# Patient Record
Sex: Male | Born: 1970 | Race: White | Hispanic: No | Marital: Married | Smoking: Current some day smoker
Health system: Southern US, Community
[De-identification: ages and names within clinical notes are randomized; demographics above are authoritative.]

## PROBLEM LIST (undated history)

## (undated) DIAGNOSIS — E782 Mixed hyperlipidemia: Secondary | ICD-10-CM

## (undated) DIAGNOSIS — I10 Essential (primary) hypertension: Secondary | ICD-10-CM

## (undated) DIAGNOSIS — R7989 Other specified abnormal findings of blood chemistry: Secondary | ICD-10-CM

## (undated) DIAGNOSIS — G4733 Obstructive sleep apnea (adult) (pediatric): Secondary | ICD-10-CM

## (undated) DIAGNOSIS — N200 Calculus of kidney: Secondary | ICD-10-CM

## (undated) DIAGNOSIS — Z8249 Family history of ischemic heart disease and other diseases of the circulatory system: Secondary | ICD-10-CM

## (undated) DIAGNOSIS — I2510 Atherosclerotic heart disease of native coronary artery without angina pectoris: Secondary | ICD-10-CM

## (undated) DIAGNOSIS — R7301 Impaired fasting glucose: Secondary | ICD-10-CM

## (undated) HISTORY — DX: Other specified abnormal findings of blood chemistry: R79.89

## (undated) HISTORY — DX: Mixed hyperlipidemia: E78.2

## (undated) HISTORY — DX: Essential (primary) hypertension: I10

## (undated) HISTORY — DX: Calculus of kidney: N20.0

## (undated) HISTORY — DX: Atherosclerotic heart disease of native coronary artery without angina pectoris: I25.10

## (undated) HISTORY — DX: Impaired fasting glucose: R73.01

## (undated) HISTORY — DX: Family history of ischemic heart disease and other diseases of the circulatory system: Z82.49

## (undated) HISTORY — DX: Obstructive sleep apnea (adult) (pediatric): G47.33

## (undated) HISTORY — DX: Morbid (severe) obesity due to excess calories: E66.01

---

## 1996-03-25 HISTORY — PX: KIDNEY STONE SURGERY: SHX686

## 2001-03-06 ENCOUNTER — Encounter: Admission: RE | Admit: 2001-03-06 | Discharge: 2001-03-06 | Payer: Self-pay | Admitting: Family Medicine

## 2001-04-09 ENCOUNTER — Ambulatory Visit (HOSPITAL_BASED_OUTPATIENT_CLINIC_OR_DEPARTMENT_OTHER): Admission: RE | Admit: 2001-04-09 | Discharge: 2001-04-09 | Payer: Self-pay | Admitting: *Deleted

## 2001-04-24 ENCOUNTER — Encounter: Admission: RE | Admit: 2001-04-24 | Discharge: 2001-04-24 | Payer: Self-pay | Admitting: Family Medicine

## 2001-09-16 ENCOUNTER — Encounter: Admission: RE | Admit: 2001-09-16 | Discharge: 2001-09-16 | Payer: Self-pay | Admitting: Family Medicine

## 2002-06-03 ENCOUNTER — Encounter: Admission: RE | Admit: 2002-06-03 | Discharge: 2002-06-03 | Payer: Self-pay | Admitting: Family Medicine

## 2002-06-08 ENCOUNTER — Encounter: Admission: RE | Admit: 2002-06-08 | Discharge: 2002-06-08 | Payer: Self-pay | Admitting: Family Medicine

## 2006-11-11 ENCOUNTER — Ambulatory Visit: Payer: Self-pay | Admitting: Pulmonary Disease

## 2006-12-11 ENCOUNTER — Ambulatory Visit: Payer: Self-pay | Admitting: Pulmonary Disease

## 2010-07-17 ENCOUNTER — Other Ambulatory Visit: Payer: Self-pay | Admitting: Sports Medicine

## 2010-07-17 DIAGNOSIS — M5412 Radiculopathy, cervical region: Secondary | ICD-10-CM

## 2010-07-18 ENCOUNTER — Ambulatory Visit
Admission: RE | Admit: 2010-07-18 | Discharge: 2010-07-18 | Disposition: A | Discharge status: Home / Self Care | Payer: BC Managed Care – PPO | Source: Ambulatory Visit | Attending: Sports Medicine | Admitting: Sports Medicine

## 2010-07-18 DIAGNOSIS — M5412 Radiculopathy, cervical region: Secondary | ICD-10-CM

## 2010-08-07 NOTE — Assessment & Plan Note (Signed)
[facility]                             PULMONARY OFFICE NOTE   Robert [name]                     MRN:          [id number]  DATE:11/11/2006                            DOB:          1970-08-24    No dictation     Barbaraann Share, MD,FCCP  Electronically Signed    [facility]Med[facility]  DD: 11/11/2006  DT: 11/12/2006  Job #: [id number]

## 2010-08-07 NOTE — Assessment & Plan Note (Signed)
[facility]                             PULMONARY OFFICE NOTE   NAME:[name], [name]                    MRN:          [id number]  DATE:11/11/2006                            DOB:          02-Jan-1971    SELF-REFERRAL SLEEP CONSULTATION   HISTORY OF PRESENT ILLNESS:  The patient is a 40 year old white male who  comes in today for evaluation of a prior documentation of sleep apnea.  The patient was diagnosed with sleep apnea approximately 6 years ago by  split-night study, and unfortunately, we have not been able to locate  this, nor has anyone kept the records.  The patient was placed on CPAP  but states that he could not sleep with it.  The patient states that he  could not have anything on his face, and therefore stopped using it.  He  states that he typically goes to bed between 8:30 and 11 and gets up at  5:30 a.m. to start his day.  He feels that he is rested.  He has been  noted to have snoring and pauses by his bed-partner.  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 physical filled out, which cannot be done until his sleep  is properly treated and evaluated.  The patient states that he does have  occasional sleep pressure with periods of inactivity, but this is not an  every day occurrence.  This is not an every day occurrence.  He  occasionally will doze with TV or movies in the evenings.  He denies  sleepiness with any driving.  Overall, his weight has been neutral over  the last 6 years.   PAST MEDICAL HISTORY:  Totally unremarkable.  The patient takes no  medications and has no known drug allergies.   SOCIAL HISTORY:  He does have children.  He has a history of smoking 1-  and-a-half packs per day and continues to do so.   FAMILY HISTORY:  Remarkable for his mother having allergies.  Otherwise,  noncontributory in first-degree relatives.   REVIEW OF SYSTEMS:  As per the  history of present illness.  Also see  patient intake form documented on the chart.   PHYSICAL EXAM:  GENERAL:  He is an obese white male in no acute  distress.  Blood pressure 118/84, pulse 84, temperature 97.9.  Weight 268 pounds.  He is 5 feet 8 inches tall.  O2 saturation on room air is 96%.  HEENT:  Pupils are equal, round, and reactive to light and  accommodation.  Extraocular muscles are intact.  Nares show mild septal  deviation to the left.  Oropharynx does show significant tissue  redundancy with elongation of the soft palate and uvula.  NECK:  Supple without JVD or lymphadenopathy.  No palpable thyromegaly.  CHEST:  totally clear.  CARDIAC:  Regular rate and rhythm.  No murmurs, rubs, or gallops.  ABDOMEN:  Soft and nontender with good bowel sounds.  GENITAL, RECTAL, AND BREASTS:  Not done and not indicated.  LOWER EXTREMITIES:  Without edema.  Pulses are intact distally.  NEUROLOGIC:  Alert and oriented with no obvious motor deficits.   IMPRESSION:  History of obstructive sleep apnea of unknown severity.  Currently, the patient is not being treated for this.  Even though he  denies a lot of the symptoms of inappropriate daytime sleepiness, he  does appear somewhat sleepy to me in the office, and I do believe that  he is more symptomatic than he believes.  Unfortunately, we have not  been able to locate his sleep study from the past, and therefore, it may  be prudent to go ahead and get him on an auto-titrate device with a mask  that he can tolerate until we can locate the study.  We can then either  do an MWT to verify his status or see if the Department of  Transportation will be satisfied with him being on CPAP compliantly for  a period of time.   PLAN:  1. Continue to try and locate the patient's old sleep study.  2. Go ahead and start an auto-titrate device with nasal pillows in the      interim.  3. I have counseled the patient on working on aggressive weight  loss.      The patient will follow up in 3 weeks or sooner if there are      problems.     Barbaraann Share, MD,FCCP  Electronically Signed    [facility]Med[facility]  DD: 11/11/2006  DT: 11/12/2006  Job #: [id number]   cc:   Gaspar Garbe, M.D.

## 2010-10-02 ENCOUNTER — Encounter (HOSPITAL_BASED_OUTPATIENT_CLINIC_OR_DEPARTMENT_OTHER)
Admission: RE | Admit: 2010-10-02 | Discharge: 2010-10-02 | Disposition: A | Discharge status: Home / Self Care | Payer: BC Managed Care – PPO | Source: Ambulatory Visit | Attending: Orthopedic Surgery | Admitting: Orthopedic Surgery

## 2010-10-03 ENCOUNTER — Ambulatory Visit (HOSPITAL_BASED_OUTPATIENT_CLINIC_OR_DEPARTMENT_OTHER)
Admission: RE | Admit: 2010-10-03 | Discharge: 2010-10-03 | Disposition: A | Discharge status: Home / Self Care | Payer: BC Managed Care – PPO | Source: Ambulatory Visit | Attending: Orthopedic Surgery | Admitting: Orthopedic Surgery

## 2010-10-03 DIAGNOSIS — G4733 Obstructive sleep apnea (adult) (pediatric): Secondary | ICD-10-CM | POA: Insufficient documentation

## 2010-10-03 DIAGNOSIS — G5600 Carpal tunnel syndrome, unspecified upper limb: Secondary | ICD-10-CM | POA: Insufficient documentation

## 2010-10-03 DIAGNOSIS — E669 Obesity, unspecified: Secondary | ICD-10-CM | POA: Insufficient documentation

## 2010-10-03 DIAGNOSIS — Z01812 Encounter for preprocedural laboratory examination: Secondary | ICD-10-CM | POA: Insufficient documentation

## 2010-10-03 DIAGNOSIS — F17200 Nicotine dependence, unspecified, uncomplicated: Secondary | ICD-10-CM | POA: Insufficient documentation

## 2010-10-03 LAB — POCT HEMOGLOBIN-HEMACUE: Hemoglobin: 16.9 g/dL (ref 13.0–17.0)

## 2010-10-12 NOTE — Op Note (Signed)
  NAMENYSIR, FERGUSSON NO.:  [id number]  MEDICAL RECORD NO.:  [id number]  LOCATION:                                 FACILITY:  PHYSICIAN:  Cindee Salt, M.D.            DATE OF BIRTH:  DATE OF PROCEDURE:  10/03/2010 DATE OF DISCHARGE:                              OPERATIVE REPORT   PREOPERATIVE DIAGNOSIS:  Carpal tunnel syndrome, right hand.  POSTOPERATIVE DIAGNOSIS:  Carpal tunnel syndrome, right hand.  OPERATION:  Decompression of right median nerve.  SURGEON:  Cindee Salt, MD  ASSISTANT:  None.  ANESTHESIA:  Forearm based IV regional.  ANESTHESIOLOGIST:  Quita Skye. Krista Blue, MD  HISTORY:  The patient is a 40 year old male with a history of carpal tunnel syndrome, EMG nerve conduction is positive which has not responded to conservative treatment.  He is elected to undergo surgical decompression.  Per, peri, and postoperative course have been discussed along with risks and complications.  He is aware that there is no guarantee with surgery, possibility of infection, recurrence injury to arteries, nerves, and tendons, incomplete relief of symptoms, and dystrophy.  In the preoperative area, the patient is seen, the extremity is marked by both the patient and surgeon, and antibiotic is given.  PROCEDURE:  The patient was brought to the operating room where a forearm based IV regional anesthetic was carried out without difficulty. He was prepped using ChloraPrep, supine position, right arm free.  A 3- minute dry time was allowed.  A time-out taken confirming the patient and procedure.  A longitudinal incision was made in the palm and carried down through the subcutaneous tissue.  Bleeders were electrocauterized with bipolar.  The superficial palmar arch identified after splitting the palmar fascia.  A right-angle retractor was placed between the flexor tendons after identifying the flexor tendon of the ring and little finger protecting the median nerve.  A  longitudinal incision was made through the carpal retinaculum.  The Sewall retractor and right- angle retractor were placed between the skin and forearm fascia.  The fascia released for approximately a centimeter and half proximal to the wrist crease under direct vision.  Canal was explored.  Compression to the nerve was apparent with thickening of the tenosynovial tissue and epineurium.  No further lesions were identified.  The wound was closed after irrigation with interrupted 5-0 Vicryl Rapide suture.  Local infiltration was given with 0.25% Marcaine without epinephrine, 5 mL was used.  Sterile compressive dressing was applied.  On deflation of the tourniquet, all fingers were immediately pinked and taken to the recovery room for observation in satisfactory condition.  He will be discharged home return to the [facility] [place] in 1 week, on Vicodin.          ______________________________ Cindee Salt, M.D.     GK/MEDQ  D:  10/03/2010  T:  10/03/2010  Job:  [id number]  Electronically Signed by Cindee Salt M.D. on 10/12/2010 09:19:58 AM

## 2010-10-26 ENCOUNTER — Ambulatory Visit (HOSPITAL_BASED_OUTPATIENT_CLINIC_OR_DEPARTMENT_OTHER)
Admission: RE | Admit: 2010-10-26 | Discharge: 2010-10-26 | Disposition: A | Discharge status: Home / Self Care | Payer: BC Managed Care – PPO | Source: Ambulatory Visit | Attending: Orthopedic Surgery | Admitting: Orthopedic Surgery

## 2010-10-26 DIAGNOSIS — G5600 Carpal tunnel syndrome, unspecified upper limb: Secondary | ICD-10-CM | POA: Insufficient documentation

## 2010-10-26 DIAGNOSIS — Z01812 Encounter for preprocedural laboratory examination: Secondary | ICD-10-CM | POA: Insufficient documentation

## 2010-10-26 LAB — POCT HEMOGLOBIN-HEMACUE: Hemoglobin: 15.6 g/dL (ref 13.0–17.0)

## 2010-12-25 NOTE — Op Note (Signed)
NAMECODY, Robert [name] NO.:  [id number]  MEDICAL RECORD NO.:  [id number]  LOCATION:                                 FACILITY:  PHYSICIAN:  Cindee Salt, M.D.       DATE OF BIRTH:  01/22/1971  DATE OF PROCEDURE:  10/26/2010 DATE OF DISCHARGE:                              OPERATIVE REPORT   PREOPERATIVE DIAGNOSIS:  Carpal tunnel syndrome, left hand.  POSTOPERATIVE DIAGNOSIS:  Carpal tunnel syndrome, left hand.  OPERATION:  Decompression of left median nerve.  SURGEON:  Cindee Salt, MD  ANESTHESIA:  Forearm-based IV regional.  ANESTHESIOLOGIST:  Janetta Hora. Gelene Mink, MD  HISTORY:  The patient is a 40 year old male with a history of carpal tunnel syndrome, EMG nerve conduction is positive.  He has undergone release of his right side.  He is admitted now for release of his left. Pre, peri, postoperative courses have been discussed along with risks and complications.  He is aware that there is no guarantee with the surgery, possibility of infection, recurrence; injury to arteries, nerves, tendons; incomplete relief of symptoms, dystrophy.  In the preoperative area, the patient is seen.  The extremity marked by both the patient and surgeon.  Antibiotic given.  PROCEDURE IN DETAIL:  The patient was brought to the operating room where a forearm-based IV regional anesthetic was carried out without difficulty, was prepped using DuraPrep, supine position, left arm free. A 3-minute dry time was allowed.  Time-out taken, confirming the patient and procedure.  A longitudinal incision was made in the skin.  While this was being done, the patient forcefully flexed his fingers, pushing my hand with the scalpel longitudinally into his palm.  This was corrected as rapidly as possible so as not to punch the knife deeply into the palm.  It did not proceed into a deep cut.  The palmar fascia was split, superficial palmar arch identified, the flexor tendons of the ring and  little fingers identified to the ulnar side of the median nerve.  Carpal retinaculum was incised with sharp dissection.  The right- angled Sewell retractor were placed between skin and forearm fascia. The fascia released for approximately a centimeter and a half proximal to the wrist crease under direct vision.  Canal was thoroughly explored. Each of the flexor tendons were visualized to be certain that nothing was cut.  The median nerve was not injured nor was the superficial palmar arch.  The area compressing to the nerve was apparent.  No further lesions were identified.  The wound was copiously irrigated with saline.  The skin then closed with interrupted 5-0 Vicryl Rapide sutures.  A local infiltration with 0.25% Marcaine without epinephrine was given.  On deflation of the tourniquet, all fingers immediately pinked after placement of a compressive dressing.  No splint was applied.  The fingers were left free.  The patient tolerated the procedure well and was taken to the recovery room for observation in satisfactory condition.  He will be discharged home to return to the [facility] of [place] in 1 week on Vicodin.          ______________________________ Cindee Salt, M.D.     GK/MEDQ  D:  10/26/2010  T:  10/26/2010  Job:  [id number]  Electronically Signed by Cindee Salt M.D. on 12/25/2010 04:37:30 PM

## 2011-03-26 HISTORY — PX: CARPAL TUNNEL RELEASE: SHX101

## 2011-09-30 ENCOUNTER — Encounter: Payer: Self-pay | Admitting: Pulmonary Disease

## 2011-10-01 ENCOUNTER — Telehealth: Payer: Self-pay | Admitting: Pulmonary Disease

## 2011-10-01 ENCOUNTER — Encounter: Payer: Self-pay | Admitting: Pulmonary Disease

## 2011-10-01 ENCOUNTER — Ambulatory Visit (INDEPENDENT_AMBULATORY_CARE_PROVIDER_SITE_OTHER): Payer: BC Managed Care – PPO | Admitting: Pulmonary Disease

## 2011-10-01 VITALS — BP 116/78 | HR 95 | Temp 98.5°F | Ht 67.5 in | Wt 267.4 lb

## 2011-10-01 DIAGNOSIS — G4733 Obstructive sleep apnea (adult) (pediatric): Secondary | ICD-10-CM

## 2011-10-01 NOTE — Telephone Encounter (Signed)
I spoke with pt and he is calling to let [facility] know he is eligible for a new cpap machine 11/23/11. I advised will forward to [facility] so he is aware. Please advise thanks

## 2011-10-01 NOTE — Telephone Encounter (Signed)
If pt will call us at that time, will be happy to send order to his dme for a new machine.  Let us know.

## 2011-10-01 NOTE — Assessment & Plan Note (Signed)
The patient has a history of severe obstructive sleep apnea in 2003, and has been doing well on CPAP since that time.  He feels the device is working well for him currently, but he needs to work more aggressively on weight loss.  He also is overdue for a new mask.  I will send an order to his DME for a new mask, but he will also need a download for DOT purposes.  I've encouraged the patient to work aggressively on weight loss, and to followup with me on a yearly basis.

## 2011-10-01 NOTE — Progress Notes (Signed)
  Subjective:    Patient ID: Robert [name], male    DOB: 12/31/1970, 41 y.o.   MRN: [id number]  HPI The patient is a 41 year old male who comes in today for management of obstructive sleep apnea.  He was diagnosed in 2003 with severe OSA, with an AHI of 46 events per hour.  He was started on CPAP, and his pressure was optimized in 2008 to 14 cm of water.  He has been on CPAP since that time, but has not followed up with me since 2008.  He denies noncompliance with the device, and has not had any breakthrough snoring.  He has been keeping up with his mask changes and supplies, but is due for a mask currently.  He feels that he has excellent daytime alertness, and feels rested in the mornings upon arising.  He denies any sleepiness issues during the day with periods of inactivity, and does not fall asleep in the evening watching television.  He has no issues with sleepiness while driving.  The patient's weight is neutral from 2008, and his Epworth score today is normal at 5.   Review of Systems  Constitutional: Negative for fever and unexpected weight change.  HENT: Negative for ear pain, nosebleeds, congestion, sore throat, rhinorrhea, sneezing, trouble swallowing, dental problem, postnasal drip and sinus pressure.   Eyes: Negative for redness and itching.  Respiratory: Negative for cough, chest tightness, shortness of breath and wheezing.   Cardiovascular: Negative for palpitations and leg swelling.  Gastrointestinal: Negative for nausea and vomiting.  Genitourinary: Negative for dysuria.  Musculoskeletal: Negative for joint swelling.  Skin: Negative for rash.  Neurological: Negative for headaches.  Hematological: Does not bruise/bleed easily.  Psychiatric/Behavioral: Negative for dysphoric mood. The patient is not nervous/anxious.   All other systems reviewed and are negative.       Objective:   Physical Exam Constitutional:  Obese male, no acute distress  HENT:  Nares patent without  discharge  Oropharynx without exudate, palate and uvula are thick and elongated.  Eyes:  Perrla, eomi, no scleral icterus  Neck:  No JVD, no TMG  Cardiovascular:  Normal rate, regular rhythm, no rubs or gallops.  No murmurs        Intact distal pulses  Pulmonary :  Normal breath sounds, no stridor or respiratory distress   No rales, rhonchi, or wheezing  Abdominal:  Soft, nondistended, bowel sounds present.  No tenderness noted.   Musculoskeletal:  No lower extremity edema noted.  Lymph Nodes:  No cervical lymphadenopathy noted  Skin:  No cyanosis noted  Neurologic:  Alert, appropriate, moves all 4 extremities without obvious deficit.         Assessment & Plan:

## 2011-10-01 NOTE — Patient Instructions (Addendum)
Will have your dme get a download off your machine.  Once this is done, will make notes on this for you to take to your DOT physical Keep up with mask changes and supplies. Work on weight loss followup with me in one year.

## 2011-10-02 NOTE — Telephone Encounter (Signed)
Called, spoke with pt.  I informed him of below per Dr. Shelle Iron.  He verbalized understanding of this and in agreement with the plan to call us back when he is eligible for the new machine so we can place order.  Nothing further needed at this time.

## 2011-10-23 ENCOUNTER — Institutional Professional Consult (permissible substitution): Payer: BC Managed Care – PPO | Admitting: Pulmonary Disease

## 2012-03-02 ENCOUNTER — Telehealth: Payer: Self-pay | Admitting: Pulmonary Disease

## 2012-03-02 DIAGNOSIS — G4733 Obstructive sleep apnea (adult) (pediatric): Secondary | ICD-10-CM

## 2012-03-02 NOTE — Telephone Encounter (Signed)
Spoke with patient, I have informed him of the below.  Patient states he has been w [facility] "for years."  Order has been placed to switch DMEs and Sleep study has been faxed to [phone] Patient aware and nothing further needed at this time.

## 2012-03-02 NOTE — Addendum Note (Signed)
Addended by: Orma Flaming D on: 03/02/2012 02:46 PM   Modules accepted: Orders

## 2012-03-02 NOTE — Telephone Encounter (Signed)
Patient returned call, [facility] has received sleep study needs order for mask. Order has been placed.

## 2012-03-02 NOTE — Telephone Encounter (Signed)
LMTCB-need to make patient aware that we can place order through [facility] to change DME's however its up to his insurance to state weather or not patient has been with [facility] to long to change. We can check this and keep patient updated.

## 2015-06-19 ENCOUNTER — Telehealth: Payer: Self-pay | Admitting: Pulmonary Disease

## 2015-06-19 NOTE — Telephone Encounter (Signed)
Spoke with pt, wants to know if he is eligible for a new cpap machine.  Pt does not have any problems with his current machine but wants to upgrade if he is eligible.  I advised that we have not seen him since 2013 and would need to re-establish care for [place] to order a new cpap for him.  Pt scheduled next available with AD.  Nothing further needed.

## 2015-08-30 ENCOUNTER — Ambulatory Visit (INDEPENDENT_AMBULATORY_CARE_PROVIDER_SITE_OTHER)
Admission: RE | Admit: 2015-08-30 | Discharge: 2015-08-30 | Disposition: A | Discharge status: Home / Self Care | Payer: BLUE CROSS/BLUE SHIELD | Source: Ambulatory Visit | Attending: Pulmonary Disease | Admitting: Pulmonary Disease

## 2015-08-30 ENCOUNTER — Ambulatory Visit (INDEPENDENT_AMBULATORY_CARE_PROVIDER_SITE_OTHER): Payer: BLUE CROSS/BLUE SHIELD | Admitting: Pulmonary Disease

## 2015-08-30 ENCOUNTER — Encounter: Payer: Self-pay | Admitting: Pulmonary Disease

## 2015-08-30 VITALS — BP 128/72 | HR 80 | Ht 67.5 in | Wt 293.0 lb

## 2015-08-30 DIAGNOSIS — G4733 Obstructive sleep apnea (adult) (pediatric): Secondary | ICD-10-CM | POA: Diagnosis not present

## 2015-08-30 DIAGNOSIS — R0600 Dyspnea, unspecified: Secondary | ICD-10-CM | POA: Diagnosis not present

## 2015-08-30 DIAGNOSIS — R0609 Other forms of dyspnea: Secondary | ICD-10-CM | POA: Diagnosis not present

## 2015-08-30 DIAGNOSIS — Z720 Tobacco use: Secondary | ICD-10-CM

## 2015-08-30 HISTORY — DX: Dyspnea, unspecified: R06.00

## 2015-08-30 HISTORY — DX: Tobacco use: Z72.0

## 2015-08-30 HISTORY — DX: Other forms of dyspnea: R06.09

## 2015-08-30 NOTE — Assessment & Plan Note (Signed)
Weight reduction 

## 2015-08-30 NOTE — Progress Notes (Signed)
Subjective:    Patient ID: Robert [name], male    DOB: 07-25-1970, 45 y.o.   MRN: [id number]  HPI   This is the case of Robert [name], 45 y.o. Male, who is being seen for OSA.   As you very well know, patient  was diagnosed in 2003 with severe OSA, with an AHI of 46 events per hour. He was started on CPAP, and his pressure was optimized in 2008 to 14 cm of water.   He started with cpap in 2003, had issues with intial cpap. He got a new machine in 2008. Since that time, he has been using his cpap. Feels better using it.  The last 1-2 yrs, machine has not been delivering enough pressure. Machine is malfunctioning sometimes.  Through the years, machine has lost its luster. Occasional snoring, gasping, choking with current machine.   DL for jan 2017 -- 100%. Can NOt do AHI.   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   Review of Systems  Constitutional: Negative.  Negative for fever and unexpected weight change.  HENT: Negative.  Negative for congestion, dental problem, ear pain, nosebleeds, postnasal drip, rhinorrhea, sinus pressure, sneezing, sore throat and trouble swallowing.   Eyes: Negative.  Negative for redness and itching.  Respiratory: Positive for shortness of breath. Negative for cough, chest tightness and wheezing.   Cardiovascular: Negative.  Negative for palpitations and leg swelling.  Gastrointestinal: Negative.  Negative for nausea and vomiting.  Endocrine: Negative.   Genitourinary: Negative.  Negative for dysuria.  Musculoskeletal: Positive for arthralgias. Negative for joint swelling.  Skin: Negative.  Negative for rash.  Allergic/Immunologic: Negative.   Neurological: Negative.  Negative for headaches.  Hematological: Negative.  Does not bruise/bleed easily.  Psychiatric/Behavioral: Negative.  Negative for dysphoric mood. The patient is not nervous/anxious.    Past Medical History  Diagnosis Date  . OSA (obstructive sleep apnea)    (-) CVA, DVT,  CA  Family History  Problem Relation Age of Onset  . Allergies Mother   . Heart disease Father   . Pancreatic cancer Father      Past Surgical History  Procedure Laterality Date  . No past surgeries      Social History   Social History  . Marital Status: Married    Spouse Name: N/A  . Number of Children: N/A  . Years of Education: N/A   Occupational History  . 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 Smoking status: Current Some Day Smoker -- 1.00 packs/day for 25 years    Types: Cigarettes  . Smokeless tobacco: Not on file     Comment: currently on patch and trying to quit  . Alcohol Use: Yes  . Drug Use: No  . Sexual Activity: Not on file   Other Topics Concern  . Not on file   Social History Narrative  Lives in [place]. Works as an Psychiatrist.  Rarely ETOH use.  Has a CDL.   No Known Allergies   No outpatient prescriptions prior to visit.   No facility-administered medications prior to visit.   No orders of the defined types were placed in this encounter.               Objective:   Physical Exam  Vitals:  Filed Vitals:   08/30/15 1336  BP: 128/72  Pulse: 80  Height: 5' 7.5" (1.715 m)  Weight: 293 lb (132.904 kg)  SpO2: 95%  Constitutional/General:  Pleasant, well-nourished, well-developed, not in any distress,  Comfortably seating.  Well kempt  Body mass index is 45.19 kg/(m^2). Wt Readings from Last 3 Encounters:  08/30/15 293 lb (132.904 kg)  10/01/11 267 lb 6.4 oz (121.292 kg)    Neck circumference:   HEENT: Pupils equal and reactive to light and accommodation. Anicteric sclerae. Normal nasal mucosa.   No oral  lesions,  mouth clear,  oropharynx clear, no postnasal drip. (-) Oral thrush. No dental caries.  Airway - Mallampati class IV  Neck: No masses. Midline trachea. No JVD, (-) LAD. (-) bruits appreciated.  Respiratory/Chest: Grossly normal chest. (-) deformity. (-) Accessory muscle use.   Symmetric expansion. (-) Tenderness on palpation.  Resonant on percussion.  Diminished BS on both lower lung zones. (-) wheezing, crackles, rhonchi (-) egophony  Cardiovascular: Regular rate and  rhythm, heart sounds normal, no murmur or gallops, no peripheral edema  Gastrointestinal:  Normal bowel sounds. Soft, non-tender. No hepatosplenomegaly.  (-) masses.   Musculoskeletal:  Normal muscle tone. Normal gait.   Extremities: Grossly normal. (-) clubbing, cyanosis.  (-) edema  Skin: (-) rash,lesions seen.   Neurological/Psychiatric : alert, oriented to time, place, person. Normal mood and affect            Assessment & Plan:  Exertional dyspnea 25 PY smoking history. Denies asthma or copd. SOB with more than ADLs. CXR today.  May ned PFT, abg. May need to R/O OHS. Will hold off for now unless more symptomatic.   Morbid obesity ([place]) Weight reduction   Tobacco user Smoking cessation  OSA (obstructive sleep apnea) NPSG 2003:  AHI 46/hr, optimal pressure 11cm Autoset 2008:  Optimal pressure 14cm.   Patient  was diagnosed in 2003 with severe OSA, with an AHI of 46 events per hour. He was started on CPAP, and his pressure was optimized in 2008 to 14 cm of water.   He started with cpap in 2003, had issues with intial cpap. He got a new machine in 2008. Since that time, he has been using his cpap. Feels better using it.  The last 1-2 yrs, machine has not been delivering enough pressure. Machine is malfunctioning sometimes.  Through the years, machine has lost its luster. Occasional snoring, gasping, choking with current machine.   DL for jan 2017 -- 100%. Can NOt do AHI.   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   Plan : We extensively discussed the diagnosis, pathophysiology, and treatment options for Obstructive Sleep Apnea (OSA).  We discussed treatment options for OSA including CPAP, BiPaP, as well as surgical options and oral  devices.   We will start patient on autocpap 5-15 cm H2O. He was on CPAP 14 > may need higher pressure now.  Patient was instructed to call the office if he/she has not received the PAP device in 1-2 weeks.  Patient was instructed to have mask, tubings, filter, reservoir cleaned at least once a week with soapy water.  Patient was instructed to call the office if he/she is having issues with the PAP device.    I advised patient to obtain sufficient amount of sleep --  7 to 8 hours at least in a 24 hr period.  Patient was advised to follow good sleep hygiene.  Patient was advised NOT to engage in activities requiring concentration and/or vigilance if he/she is and  sleepy.  Patient is NOT to drive if he/she is sleepy.  Return to clinic in 2-3 mos.  Monica Becton, MD 08/30/2015, 2:16 PM [facility] Pager [phone] After 3 pm or if no answer, call [phone]

## 2015-08-30 NOTE — Assessment & Plan Note (Signed)
NPSG 2003:  AHI 46/hr, optimal pressure 11cm Autoset 2008:  Optimal pressure 14cm.   Patient  was diagnosed in 2003 with severe OSA, with an AHI of 46 events per hour. He was started on CPAP, and his pressure was optimized in 2008 to 14 cm of water.   He started with cpap in 2003, had issues with intial cpap. He got a new machine in 2008. Since that time, he has been using his cpap. Feels better using it.  The last 1-2 yrs, machine has not been delivering enough pressure. Machine is malfunctioning sometimes.  Through the years, machine has lost its luster. Occasional snoring, gasping, choking with current machine.   DL for jan 2017 -- 100%. Can NOt do AHI.   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   Plan : We extensively discussed the diagnosis, pathophysiology, and treatment options for Obstructive Sleep Apnea (OSA).  We discussed treatment options for OSA including CPAP, BiPaP, as well as surgical options and oral devices.   We will start patient on autocpap 5-15 cm H2O. He was on CPAP 14 > may need higher pressure now.  Patient was instructed to call the office if he/she has not received the PAP device in 1-2 weeks.  Patient was instructed to have mask, tubings, filter, reservoir cleaned at least once a week with soapy water.  Patient was instructed to call the office if he/she is having issues with the PAP device.    I advised patient to obtain sufficient amount of sleep --  7 to 8 hours at least in a 24 hr period.  Patient was advised to follow good sleep hygiene.  Patient was advised NOT to engage in activities requiring concentration and/or vigilance if he/she is and  sleepy.  Patient is NOT to drive if he/she is sleepy.

## 2015-08-30 NOTE — Patient Instructions (Signed)
  It was a pleasure taking care of you today!  You are diagnosed with Obstructive Sleep Apnea or OSA.  You stop breathing  46 times an hour.   We will order you a new CPAP  machine.  Your insurance may require you to do a sleep study to get a new CPAP machine. We will let you know. Please call the office if you do NOT receive your machine in the next 1-2 weeks.   Please make sure you use your CPAP device everytime you sleep.  We will monitor the usage of your machine per your insurance requirement.  Your insurance company may take the machine from you if you are not using it regularly.   Please clean the mask, tubings, filter, water reservoir with soapy water every week.  Please use distilled water for the water reservoir.   Please call the office or your machine provider (DME company) if you are having issues with the device.   Return to clinic in 2-3 months to see Dr. Corrie Dandy or NP.

## 2015-08-30 NOTE — Assessment & Plan Note (Signed)
Smoking cessation  

## 2015-08-30 NOTE — Assessment & Plan Note (Signed)
25 PY smoking history. Denies asthma or copd. SOB with more than ADLs. CXR today.  May ned PFT, abg. May need to R/O OHS. Will hold off for now unless more symptomatic.

## 2015-09-08 ENCOUNTER — Telehealth: Payer: Self-pay | Admitting: Pulmonary Disease

## 2015-09-08 DIAGNOSIS — G4733 Obstructive sleep apnea (adult) (pediatric): Secondary | ICD-10-CM

## 2015-09-08 NOTE — Telephone Encounter (Signed)
Spoke with pt. States that he would rather have his CPAP set up with [facility] instead of [facility]. Order has been placed. Nothing further was needed.

## 2015-11-09 ENCOUNTER — Ambulatory Visit (INDEPENDENT_AMBULATORY_CARE_PROVIDER_SITE_OTHER): Payer: BLUE CROSS/BLUE SHIELD | Admitting: Pulmonary Disease

## 2015-11-09 ENCOUNTER — Encounter: Payer: Self-pay | Admitting: Pulmonary Disease

## 2015-11-09 DIAGNOSIS — Z720 Tobacco use: Secondary | ICD-10-CM

## 2015-11-09 DIAGNOSIS — G4733 Obstructive sleep apnea (adult) (pediatric): Secondary | ICD-10-CM | POA: Diagnosis not present

## 2015-11-09 NOTE — Assessment & Plan Note (Signed)
NPSG 2003:  AHI 46/hr, optimal pressure 11cm Autoset 2008:  Optimal pressure 14cm.   Patient  was diagnosed in 2003 with severe OSA, with an AHI of 46 events per hour. He was started on CPAP, and his pressure was optimized in 2008 to 14 cm of water.   He started with cpap in 2003, had issues with intial cpap. He got a new machine in 2008. Since that time, he has been using his cpap. Feels better using it.  The last 1-2 yrs, machine has not been delivering enough pressure. Machine is malfunctioning sometimes.  Through the years, machine has lost its luster. Occasional snoring, gasping, choking with current machine.   We ended up getting a new auto CPAP machine on him in June 2017. 5-15 cm water. He feels better using it. More energy. Less sleepiness. No issues with it.  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   Plan : We extensively discussed the diagnosis, pathophysiology, and treatment options for Obstructive Sleep Apnea (OSA).  Cont autocpap 5-15 cm H2O. excellent download the last month. AHI of 2.   Patient was instructed to call the office if he/she has not received the PAP device in 1-2 weeks.  Patient was instructed to have mask, tubings, filter, reservoir cleaned at least once a week with soapy water.  Patient was instructed to call the office if he/she is having issues with the PAP device.    I advised patient to obtain sufficient amount of sleep --  7 to 8 hours at least in a 24 hr period.  Patient was advised to follow good sleep hygiene.  Patient was advised NOT to engage in activities requiring concentration and/or vigilance if he/she is and  sleepy.  Patient is NOT to drive if he/she is sleepy.

## 2015-11-09 NOTE — Assessment & Plan Note (Signed)
Weight reduction 

## 2015-11-09 NOTE — Progress Notes (Signed)
Subjective:    Patient ID: Robert [name], male    DOB: 27-Feb-1971, 45 y.o.   MRN: [id number]  HPI   This is the case of Robert [name], 45 y.o. Male, who is being seen for OSA.   As you very well know, patient  was diagnosed in 2003 with severe OSA, with an AHI of 46 events per hour. He was started on CPAP, and his pressure was optimized in 2008 to 14 cm of water.   He started with cpap in 2003, had issues with intial cpap. He got a new machine in 2008. Since that time, he has been using his cpap. Feels better using it.  The last 1-2 yrs, machine has not been delivering enough pressure. Machine is malfunctioning sometimes.  Through the years, machine has lost its luster. Occasional snoring, gasping, choking with current machine.   DL for jan 2017 -- 100%. Can NOt do AHI.   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   ROV 11/09/15 Patient returns to office as follow-up on his CPAP. Since last seen, he ended up getting a new auto CPAP machine. He feels better using it. More energy. No issues with it. Download last month, 100%, AHI of 2.7. He continues to smoke cigarettes, a pack a day. Less dyspnea. No medical issues since last seen.  Review of Systems  Constitutional: Negative.  Negative for fever and unexpected weight change.  HENT: Negative.  Negative for congestion, dental problem, ear pain, nosebleeds, postnasal drip, rhinorrhea, sinus pressure, sneezing, sore throat and trouble swallowing.   Eyes: Negative.  Negative for redness and itching.  Respiratory: Positive for shortness of breath. Negative for cough, chest tightness and wheezing.   Cardiovascular: Negative.  Negative for palpitations and leg swelling.  Gastrointestinal: Negative.  Negative for nausea and vomiting.  Endocrine: Negative.   Genitourinary: Negative.  Negative for dysuria.  Musculoskeletal: Positive for arthralgias. Negative for joint swelling.  Skin: Negative.  Negative for rash.  Allergic/Immunologic:  Negative.   Neurological: Negative.  Negative for headaches.  Hematological: Negative.  Does not bruise/bleed easily.  Psychiatric/Behavioral: Negative.  Negative for dysphoric mood. The patient is not nervous/anxious.    Past Medical History:  Diagnosis Date  . OSA (obstructive sleep apnea)    (-) CVA, DVT, CA  Family History  Problem Relation Age of Onset  . Allergies Mother   . Heart disease Father   . Pancreatic cancer Father      Past Surgical History:  Procedure Laterality Date  . NO PAST SURGERIES      Social History   Social History  . Marital status: Married    Spouse name: N/A  . Number of children: N/A  . Years of education: N/A   Occupational History  . 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 Smoking status: Current Some Day Smoker    Packs/day: 1.00    Years: 25.00    Types: Cigarettes  . Smokeless tobacco: Not on file     Comment: currently on patch and trying to quit  . Alcohol use Yes  . Drug use: No  . Sexual activity: Not on file   Other Topics Concern  . Not on file   Social History Narrative  . No narrative on file  Lives in [place]. Works as an Psychiatrist.  Rarely ETOH use.  Has a CDL.   No Known Allergies   No outpatient prescriptions prior to visit.   No  facility-administered medications prior to visit.    No orders of the defined types were placed in this encounter.              Objective:   Physical Exam  Vitals:  Vitals:   11/09/15 1438  BP: 138/78  Pulse: 87  SpO2: 96%  Weight: 294 lb (133.4 kg)  Height: 5' 7.5" (1.715 m)    Constitutional/General:  Pleasant, well-nourished, well-developed, not in any distress,  Comfortably seating.  Well kempt  Body mass index is 45.37 kg/m. Wt Readings from Last 3 Encounters:  11/09/15 294 lb (133.4 kg)  08/30/15 293 lb (132.9 kg)  10/01/11 267 lb 6.4 oz (121.3 kg)    Neck circumference:   HEENT: Pupils equal and reactive to light  and accommodation. Anicteric sclerae. Normal nasal mucosa.   No oral  lesions,  mouth clear,  oropharynx clear, no postnasal drip. (-) Oral thrush. No dental caries.  Airway - Mallampati class IV  Neck: No masses. Midline trachea. No JVD, (-) LAD. (-) bruits appreciated.  Respiratory/Chest: Grossly normal chest. (-) deformity. (-) Accessory muscle use.  Symmetric expansion. (-) Tenderness on palpation.  Resonant on percussion.  Diminished BS on both lower lung zones. (-) wheezing, crackles, rhonchi (-) egophony  Cardiovascular: Regular rate and  rhythm, heart sounds normal, no murmur or gallops, no peripheral edema  Gastrointestinal:  Normal bowel sounds. Soft, non-tender. No hepatosplenomegaly.  (-) masses.   Musculoskeletal:  Normal muscle tone. Normal gait.   Extremities: Grossly normal. (-) clubbing, cyanosis.  (-) edema  Skin: (-) rash,lesions seen.   Neurological/Psychiatric : alert, oriented to time, place, person. Normal mood and affect            Assessment & Plan:  OSA (obstructive sleep apnea) NPSG 2003:  AHI 46/hr, optimal pressure 11cm Autoset 2008:  Optimal pressure 14cm.   Patient  was diagnosed in 2003 with severe OSA, with an AHI of 46 events per hour. He was started on CPAP, and his pressure was optimized in 2008 to 14 cm of water.   He started with cpap in 2003, had issues with intial cpap. He got a new machine in 2008. Since that time, he has been using his cpap. Feels better using it.  The last 1-2 yrs, machine has not been delivering enough pressure. Machine is malfunctioning sometimes.  Through the years, machine has lost its luster. Occasional snoring, gasping, choking with current machine.   We ended up getting a new auto CPAP machine on him in June 2017. 5-15 cm water. He feels better using it. More energy. Less sleepiness. No issues with it.  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   Plan : We extensively  discussed the diagnosis, pathophysiology, and treatment options for Obstructive Sleep Apnea (OSA).  Cont autocpap 5-15 cm H2O. excellent download the last month. AHI of 2.   Patient was instructed to call the office if he/she has not received the PAP device in 1-2 weeks.  Patient was instructed to have mask, tubings, filter, reservoir cleaned at least once a week with soapy water.  Patient was instructed to call the office if he/she is having issues with the PAP device.    I advised patient to obtain sufficient amount of sleep --  7 to 8 hours at least in a 24 hr period.  Patient was advised to follow good sleep hygiene.  Patient was advised NOT to engage in activities requiring concentration and/or  vigilance if he/she is and  sleepy.  Patient is NOT to drive if he/she is sleepy.      Tobacco user Advised on smoking cessation.  Morbid obesity ([place]) Weight reduction.  Return to clinic in 1 yr.   Monica Becton, MD 11/09/2015, 3:22 PM [facility] Pager [phone] After 3 pm or if no answer, call [phone]

## 2015-11-09 NOTE — Patient Instructions (Signed)
    It was a pleasure taking care of you today!  Continue using your CPAP machine.   Please make sure you use your CPAP device everytime you sleep.  We will monitor the usage of your machine per your insurance requirement.  Your insurance company may take the machine from you if you are not using it regularly.   Please clean the mask, tubings, filter, water reservoir with soapy water every week.  Please use distilled water for the water reservoir.   Please call the office or your machine provider (DME company) if you are having issues with the device.   Return to clinic in 1 year.

## 2015-11-09 NOTE — Assessment & Plan Note (Signed)
Advised on smoking cessation.

## 2015-12-18 ENCOUNTER — Encounter: Payer: Self-pay | Admitting: Pulmonary Disease

## 2016-06-07 IMAGING — DX DG CHEST 2V
2 series · 2 of 2 positions shown · non-contrast
Comparison: None.

CLINICAL DATA: Dyspnea

EXAM:
CHEST  2 VIEW

[chest pa]
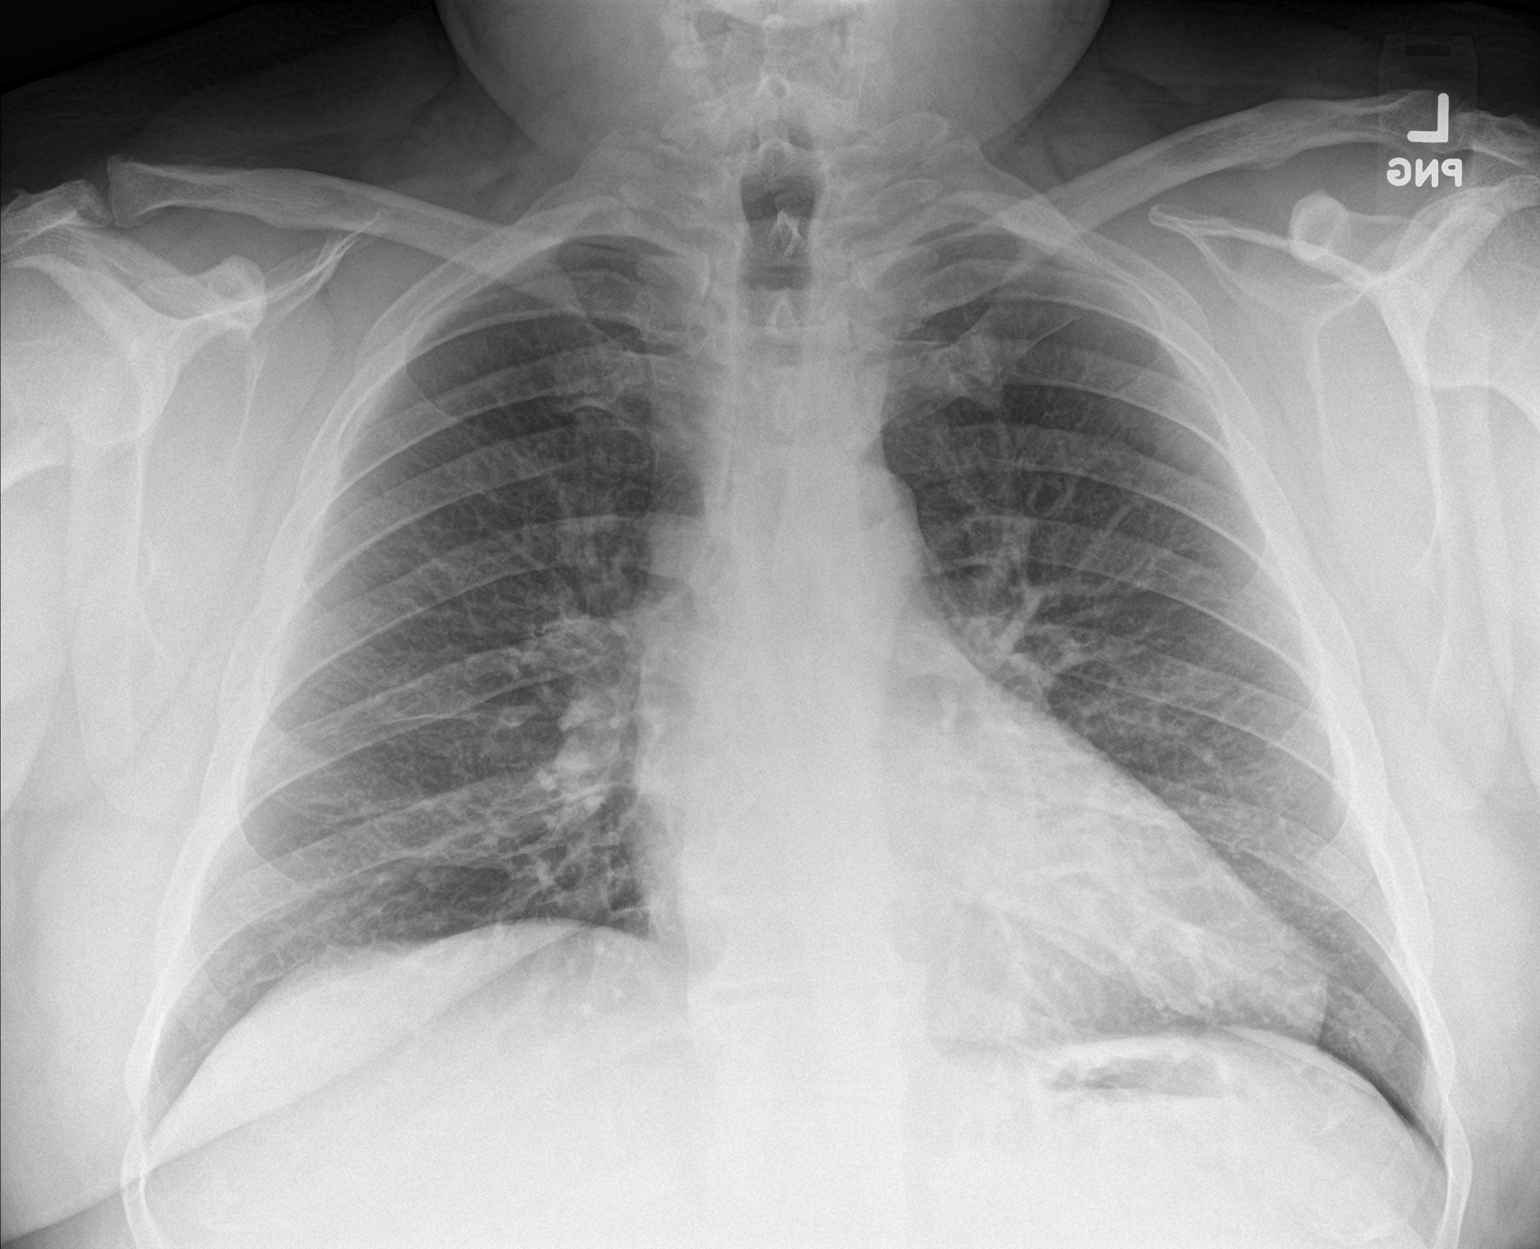

[chest lat]
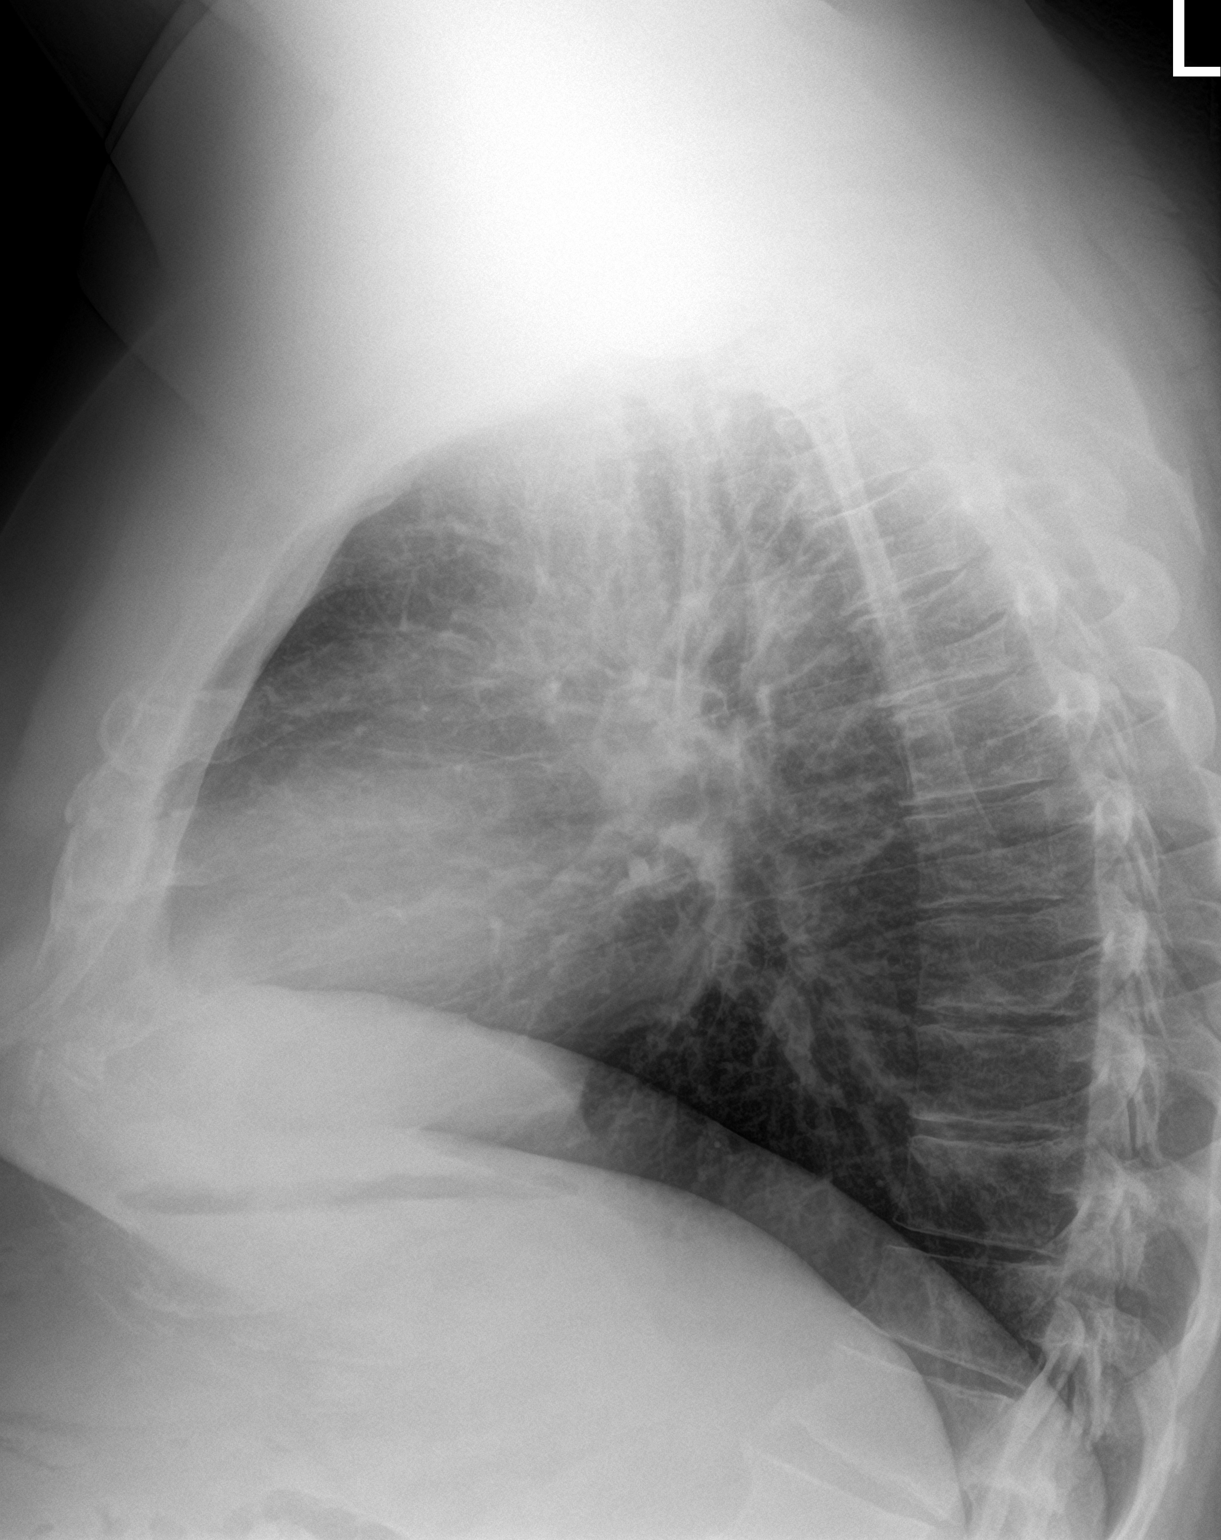

[2 of 2 positions shown; findings below may reference images not displayed]

FINDINGS: Normal heart size. Lungs clear. No pneumothorax. No pleural
effusion.
IMPRESSION: No active cardiopulmonary disease.

## 2016-10-11 ENCOUNTER — Ambulatory Visit (INDEPENDENT_AMBULATORY_CARE_PROVIDER_SITE_OTHER): Payer: 59 | Admitting: Pulmonary Disease

## 2016-10-11 ENCOUNTER — Encounter: Payer: Self-pay | Admitting: Pulmonary Disease

## 2016-10-11 VITALS — BP 124/88 | HR 87 | Ht 67.5 in | Wt 296.2 lb

## 2016-10-11 DIAGNOSIS — G4733 Obstructive sleep apnea (adult) (pediatric): Secondary | ICD-10-CM

## 2016-10-11 DIAGNOSIS — Z6841 Body Mass Index (BMI) 40.0 and over, adult: Secondary | ICD-10-CM

## 2016-10-11 NOTE — Patient Instructions (Signed)
Follow up in 1 year.

## 2016-10-11 NOTE — Progress Notes (Signed)
No current outpatient prescriptions on file prior to visit.   No current facility-administered medications on file prior to visit.      Chief Complaint  Patient presents with  . 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 Wears CPAP nightly, pressure 5-15cmH2O. Denies problems with mask or pressure.  DME: [facility]     Sleep tests PSG 04/09/01 >> AHI 46 Auto CPAP 09/11/16 to 10/10/16 >> used on 30 of 30 nights with average 8 hrs 25 min.  Average AHI 1.5 with median CPAP 14 and 95 th percentile CPAP 15 cm H2O  Past medical history Arthritis  Past surgical history, Family history, Social history, Allergies all reviewed.  Vital Signs BP 124/88 (BP Location: Left Arm, Cuff Size: Normal)   Pulse 87   Ht 5' 7.5" (1.715 m)   Wt 296 lb 3.2 oz (134.4 kg)   SpO2 97%   BMI 45.71 kg/m   History of Present Illness Robert [name] is a 46 y.o. male with obstructive sleep apnea.  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  He uses CPAP nightly.  He is sleeping well and no issues with daytime sleepiness.  No issues with mask fit.  Physical Exam  General - No distress Eyes - wears glasses ENT - No sinus tenderness, no oral exudate, no LAN Cardiac - s1s2 regular, no murmur Chest - No wheeze/rales/dullness Back - No focal tenderness Abd - Soft, non-tender Ext - No edema Neuro - Normal strength Skin - No rashes Psych - normal mood, and behavior   Assessment/Plan  Obstructive sleep apnea. - he is compliant with therapy and reports benefit from CPAP - continue auto CPAP  - no contraindications for him to continue his DOT license  Obesity. - discussed importance of weight loss   Patient Instructions  Follow up in 1 year    Chesley Mires, MD [place] Pager:  [phone] 10/11/2016, 4:44 PM

## 2016-10-17 ENCOUNTER — Emergency Department (HOSPITAL_BASED_OUTPATIENT_CLINIC_OR_DEPARTMENT_OTHER)
Admission: EM | Admit: 2016-10-17 | Discharge: 2016-10-18 | Disposition: A | Discharge status: Home / Self Care | Payer: 59 | Attending: Emergency Medicine | Admitting: Emergency Medicine

## 2016-10-17 ENCOUNTER — Encounter (HOSPITAL_BASED_OUTPATIENT_CLINIC_OR_DEPARTMENT_OTHER): Payer: Self-pay | Admitting: *Deleted

## 2016-10-17 DIAGNOSIS — T7802XA Anaphylactic reaction due to shellfish (crustaceans), initial encounter: Secondary | ICD-10-CM | POA: Diagnosis not present

## 2016-10-17 DIAGNOSIS — F17210 Nicotine dependence, cigarettes, uncomplicated: Secondary | ICD-10-CM | POA: Diagnosis not present

## 2016-10-17 DIAGNOSIS — T781XXA Other adverse food reactions, not elsewhere classified, initial encounter: Secondary | ICD-10-CM | POA: Diagnosis present

## 2016-10-17 DIAGNOSIS — T782XXA Anaphylactic shock, unspecified, initial encounter: Secondary | ICD-10-CM

## 2016-10-17 DIAGNOSIS — Z79899 Other long term (current) drug therapy: Secondary | ICD-10-CM | POA: Diagnosis not present

## 2016-10-17 MED ORDER — SODIUM CHLORIDE 0.9 % IV BOLUS (SEPSIS)
1000.0000 mL | Freq: Once | INTRAVENOUS | Status: AC
  Administered 2016-10-17: 1000 mL via INTRAVENOUS

## 2016-10-17 MED ORDER — EPINEPHRINE 0.3 MG/0.3ML IJ SOAJ
0.3000 mg | Freq: Once | INTRAMUSCULAR | Status: AC
  Administered 2016-10-17: 0.3 mg via INTRAMUSCULAR
  Filled 2016-10-17: qty 0.3

## 2016-10-17 MED ORDER — FAMOTIDINE IN NACL 20-0.9 MG/50ML-% IV SOLN
20.0000 mg | Freq: Once | INTRAVENOUS | Status: AC
  Administered 2016-10-17: 20 mg via INTRAVENOUS
  Filled 2016-10-17: qty 50

## 2016-10-17 MED ORDER — METHYLPREDNISOLONE SODIUM SUCC 125 MG IJ SOLR
125.0000 mg | Freq: Once | INTRAMUSCULAR | Status: AC
Start: 2016-10-17 — End: 2016-10-17
  Administered 2016-10-17: 125 mg via INTRAVENOUS
  Filled 2016-10-17: qty 2

## 2016-10-17 NOTE — ED Triage Notes (Signed)
pt c/o allergic reaction to shrimp, pt ate shrimp x 3 hrs ago, PTA 18m total benadryl, c/o lip numbness, and tongue " feels funny" , no resp distress noted

## 2016-10-17 NOTE — ED Provider Notes (Signed)
[facility] DEPT [facility] Provider Note   CSN: [id number] Arrival date & time: 10/17/16  2118  By signing my name below, I, Robert [name], attest that this documentation has been prepared under the direction and in the presence of Quintella Reichert, MD. Electronically Signed: Margit [name], ED Scribe. 10/17/16. 9:39 PM.  History   Chief Complaint Chief Complaint  Patient presents with  . Allergic Reaction    HPI Robert [name] is a 46 y.o. male who presents to the Emergency Department complaining of an allergic reaction s/p eating shrimp that occurred ~ 3 hours PTA. Associated sx include itchiness, facial swelling, mouth swelling, and extremity edema. He took a total of 100 mg of benadryl before coming to the ED. Pt reports he has had shrimp in the past, but only became itchy. He is allergic to penicillins and shellfish. Pt denies CP, SOB, trouble swallowing, fever, and dizziness.   The history is provided by the patient. No language interpreter was used.    Past Medical History:  Diagnosis Date  . OSA (obstructive sleep apnea)     Patient Active Problem List   Diagnosis Date Noted  . Exertional dyspnea 08/30/2015  . Morbid obesity ([place]) 08/30/2015  . Tobacco user 08/30/2015  . OSA (obstructive sleep apnea) 10/01/2011    Past Surgical History:  Procedure Laterality Date  . CARPAL TUNNEL RELEASE  2013  . [place]   internal removal       Home Medications    Prior to Admission medications   Medication Sig Start Date End Date Taking? Authorizing Provider  diphenhydrAMINE (BENADRYL) 25 MG tablet Take 100 mg by mouth every 6 (six) hours as needed.   Yes [provider]  glucosamine-chondroitin 500-400 MG tablet Take 1 tablet by mouth 3 (three) times daily.    [provider]  HYDROcodone-acetaminophen (NORCO/VICODIN) 5-325 MG tablet Take 1 tablet by mouth as needed. For kidney stones    [provider]  Omega-3 Fatty Acids  (FISH OIL) 1000 MG CAPS Take 1 capsule by mouth daily.    [provider]    Family History Family History  Problem Relation Age of Onset  . Allergies Mother   . Heart disease Father   . Pancreatic cancer Father     Social History Social History  Substance Use Topics  . Smoking status: Current Some Day Smoker    Packs/day: 0.50    Years: 25.00    Types: Cigarettes  . Smokeless tobacco: Never Used     Comment: heaviest was 1 PPD x 4 years  . Alcohol use Yes     Comment: rare     Allergies   Penicillins and Shellfish allergy   Review of Systems Review of Systems  Constitutional: Negative for fever.  HENT: Positive for facial swelling. Negative for trouble swallowing.   Respiratory: Negative for shortness of breath.   Cardiovascular: Positive for leg swelling. Negative for chest pain.  Allergic/Immunologic: Positive for food allergies.  Neurological: Negative for dizziness.  All other systems reviewed and are negative.    Physical Exam Updated Vital Signs BP (!) 64/46 (BP Location: Left Arm)   Pulse 98   Temp 97.7 F (36.5 C)   Resp 16   Ht 5' 7"  (1.702 m)   Wt 294 lb (133.4 kg)   SpO2 98%   BMI 46.05 kg/m   Physical Exam  Constitutional: He is oriented to person, place, and time. He appears well-developed and well-nourished.  HENT:  Head: Normocephalic and atraumatic.  Mouth/Throat: No uvula swelling.  Eyes:  Periorbital edema.  Cardiovascular: Normal rate and regular rhythm.   No murmur heard. Pulmonary/Chest: Effort normal and breath sounds normal. No stridor. No respiratory distress.  Abdominal: Soft. There is no tenderness. There is no rebound and no guarding.  Musculoskeletal: He exhibits edema. He exhibits no tenderness.  Neurological: He is alert and oriented to person, place, and time.  Skin: Skin is warm and dry.  Diffuse urticaria.   Psychiatric: He has a normal mood and affect. His behavior is normal.  Nursing note and vitals  reviewed.    ED Treatments / Results  DIAGNOSTIC STUDIES: Oxygen Saturation is 98% on RA, normal by my interpretation.   COORDINATION OF CARE: 9:39 PM-Discussed next steps with pt which includes a shot of epi. Pt verbalized understanding and is agreeable with the plan.   Labs (all labs ordered are listed, but only abnormal results are displayed) Labs Reviewed - No data to display  EKG  EKG Interpretation None       Radiology No results found.  Procedures Procedures (including critical care time)  CRITICAL CARE Performed by: Quintella Reichert, MD Total critical care time: 30 minutes Critical care time was exclusive of separately billable procedures and treating other patients. Critical care was necessary to treat or prevent imminent or life-threatening deterioration. Critical care was time spent personally by me on the following activities: development of treatment plan with patient and/or surrogate as well as nursing, discussions with consultants, evaluation of patient's response to treatment, examination of patient, obtaining history from patient or surrogate, ordering and performing treatments and interventions, ordering and review of laboratory studies, ordering and review of radiographic studies, pulse oximetry and re-evaluation of patient's condition.   Medications Ordered in ED Medications - No data to display   Initial Impression / Assessment and Plan / ED Course  I have reviewed the triage vital signs and the nursing notes.  Pertinent labs & imaging results that were available during my care of the patient were reviewed by me and considered in my medical decision making (see chart for details).  Clinical Course as of Oct 18 2219  Thu Oct 17, 2016  2220 Pt was rechecked and is feeling significantly better. Urticaria is resolving.   [JG]    Clinical Course User Index [JG] Robert [name]    Patient here for evaluation of swelling of face, hands, feeling  dizzy after eating shrimp. 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 He did take 100 mg of Benadryl prior to ED arrival. On arrival patient is hypotensive with facial edema and diffuse urticaria. He was treated for anaphylaxis with epinephrine, Solu-Medrol, Pepcid. On repeat assessment patient was improved and continued to improve throughout his ED stay. Discussed recommendation for observation for minimum of 4 hours that the three-hour time point patient requests discharge home. Discussed risks and benefits of early discharge and patient acknowledges these risks. Patient was discharged home with close instructions for shellfish allergy. Discussed importance of obtaining prescription for epinephrine. Providing prescriptions for prednisone as well as Pepcid. Discussed when necessary Benadryl. Close return precautions discussed as well.  Final Clinical Impressions(s) / ED Diagnoses   Final diagnoses:  Anaphylaxis, initial encounter    New Prescriptions New Prescriptions   No medications on file   I personally performed the services described in this documentation, which was scribed in my presence. The recorded information has been reviewed and is  accurate.    Quintella Reichert, MD 10/18/16 1324

## 2016-10-18 MED ORDER — PREDNISONE 10 MG PO TABS: 40.0000 mg | ORAL_TABLET | Freq: Every day | ORAL | 0 refills | Status: DC

## 2016-10-18 MED ORDER — EPINEPHRINE 0.3 MG/0.3ML IJ SOAJ: 0.3000 mg | Freq: Once | INTRAMUSCULAR | 1 refills | Status: AC

## 2016-10-18 MED ORDER — FAMOTIDINE 20 MG PO TABS: 20.0000 mg | ORAL_TABLET | Freq: Two times a day (BID) | ORAL | 0 refills | Status: DC

## 2016-10-18 NOTE — Discharge Instructions (Signed)
Do not eat shrimp or other shellfish (crab, lobster).

## 2018-09-13 ENCOUNTER — Encounter (HOSPITAL_BASED_OUTPATIENT_CLINIC_OR_DEPARTMENT_OTHER): Payer: Self-pay | Admitting: Emergency Medicine

## 2018-09-13 ENCOUNTER — Emergency Department (HOSPITAL_BASED_OUTPATIENT_CLINIC_OR_DEPARTMENT_OTHER)
Admission: EM | Admit: 2018-09-13 | Discharge: 2018-09-13 | Disposition: A | Discharge status: Home / Self Care | Payer: 59 | Attending: Emergency Medicine | Admitting: Emergency Medicine

## 2018-09-13 ENCOUNTER — Other Ambulatory Visit: Payer: Self-pay

## 2018-09-13 DIAGNOSIS — M79601 Pain in right arm: Secondary | ICD-10-CM | POA: Diagnosis present

## 2018-09-13 DIAGNOSIS — Z79899 Other long term (current) drug therapy: Secondary | ICD-10-CM | POA: Diagnosis not present

## 2018-09-13 DIAGNOSIS — F17210 Nicotine dependence, cigarettes, uncomplicated: Secondary | ICD-10-CM | POA: Insufficient documentation

## 2018-09-13 DIAGNOSIS — M5412 Radiculopathy, cervical region: Secondary | ICD-10-CM | POA: Insufficient documentation

## 2018-09-13 MED ORDER — OXYCODONE-ACETAMINOPHEN 10-325 MG PO TABS: 1.0000 | ORAL_TABLET | Freq: Four times a day (QID) | ORAL | 0 refills | Status: AC | PRN

## 2018-09-13 NOTE — ED Notes (Signed)
Pt understood dc material and referral information. NAD noted. Script sent electronically. All questions answered to satisfaction. Pt escorted to checkout counter.

## 2018-09-13 NOTE — ED Provider Notes (Signed)
[facility] Provider Note: Georgena Spurling, MD, FACEP  CSN: [id number] MRN: [id number] ARRIVAL: 09/13/18 at [place]: [place]  Arm Pain   HISTORY OF PRESENT ILLNESS  09/13/18 5:18 AM Robert [name] is a 48 y.o. male with 3 to 4 weeks of pain on the right side of his neck radiating down his right arm and about the C6 dermatome.  The pain is located primarily in the right upper arm.  Pain is worse with anterior flexion of the neck or leaning of the neck to the right.  It is also worse with certain positions and better with certain positions.  He rates his pain as a 10 out of 10 presently.  He is seen a chiropractor twice without relief.  He saw an orthopedist 2 days ago and was started on his prednisone and a muscle relaxant, again without relief.  He has occasional tingling but no frank numbness or weakness.  He denies injury to his head or neck.  He has taken some Norco he had from a previous kidney stone without adequate relief.   Past Medical History:  Diagnosis Date  . OSA (obstructive sleep apnea)     Past Surgical History:  Procedure Laterality Date  . CARPAL TUNNEL RELEASE  2013  . [place]   internal removal    Family History  Problem Relation Age of Onset  . Allergies Mother   . Heart disease Father   . Pancreatic cancer Father     Social History   Tobacco Use  . Smoking status: Current Some Day Smoker    Packs/day: 0.50    Years: 25.00    Pack years: 12.50    Types: Cigarettes  . Smokeless tobacco: Never Used  . Tobacco comment: heaviest was 1 PPD x 4 years  Substance Use Topics  . Alcohol use: Yes    Comment: rare  . Drug use: No    Prior to Admission medications   Medication Sig Start Date End Date Taking? Authorizing Provider  diphenhydrAMINE (BENADRYL) 25 MG tablet Take 100 mg by mouth every 6 (six) hours as needed.    [provider]  famotidine (PEPCID) 20 MG tablet Take 1 tablet (20  mg total) by mouth 2 (two) times daily. 10/18/16   Quintella Reichert, MD  glucosamine-chondroitin 500-400 MG tablet Take 1 tablet by mouth 3 (three) times daily.    [provider]  HYDROcodone-acetaminophen (NORCO/VICODIN) 5-325 MG tablet Take 1 tablet by mouth as needed. For kidney stones    [provider]  Omega-3 Fatty Acids (FISH OIL) 1000 MG CAPS Take 1 capsule by mouth daily.    [provider]  predniSONE (DELTASONE) 10 MG tablet Take 4 tablets (40 mg total) by mouth daily. 10/18/16   Quintella Reichert, MD    Allergies Penicillins and Shellfish allergy   REVIEW OF SYSTEMS  Negative except as noted here or in the History of Present Illness.   PHYSICAL EXAMINATION  Initial Vital Signs Blood pressure 138/69, pulse 80, temperature 98 F (36.7 C), temperature source Oral, resp. rate 18, height 5' 7"  (1.702 m), weight (!) 140.6 kg, SpO2 100 %.  Examination General: Well-developed, well-nourished male in no acute distress; appearance consistent with age of record HENT: normocephalic; atraumatic Eyes: Normal appearance Neck: supple; anterior flexion or leaning to the right reproduces pain Heart: regular rate and rhythm Lungs: clear to auscultation bilaterally Abdomen: soft; nondistended; nontender; bowel sounds present Extremities: No  deformity; full range of motion Neurologic: Awake, alert and oriented; motor function intact in all extremities and symmetric; sensation intact and symmetric in upper extremities; no facial droop Skin: Warm and dry Psychiatric: Normal mood and affect   RESULTS  Summary of this visit's results, reviewed by myself:   EKG Interpretation  Date/Time:    Ventricular Rate:    PR Interval:    QRS Duration:   QT Interval:    QTC Calculation:   R Axis:     Text Interpretation:        Laboratory Studies: No results found for this or any previous visit (from the past 24 hour(s)). Imaging Studies: No results found.  ED  COURSE and MDM  Nursing notes and initial vitals signs, including pulse oximetry, reviewed.  Vitals:   09/13/18 0511 09/13/18 0512  BP:  138/69  Pulse:  80  Resp:  18  Temp:  98 F (36.7 C)  TempSrc:  Oral  SpO2:  100%  Weight: (!) 140.6 kg   Height: 5' 7"  (1.702 m)    The patient's pain is consistent with a right C6 radiculopathy.  We will refer to neurosurgery if he wishes to follow-up with them as opposed to his current orthopedist.  PROCEDURES    ED DIAGNOSES     ICD-10-CM   1. Cervical radiculopathy at C6  M54.12        Eren Puebla, Jenny Reichmann, MD 09/13/18 0530

## 2018-09-13 NOTE — ED Triage Notes (Signed)
Pt states that approx 3-4 weeks ago his right side neck and arm has been hurting. Went to [facility] and did not help. Tuesday started getting worse and went to chiropractor again Wednesday. Still no relief so saw Ortho Friday was prescribed a steroid and muscle relaxer. Has taken them with no relief. States numbness and tingling. Denies injury of arm or neck. Can hold his arm above his head and get some relief.

## 2018-09-13 NOTE — ED Triage Notes (Signed)
Has taken 3 doses of prednisone and 3 tablets total of robaxin

## 2018-09-13 NOTE — ED Notes (Signed)
ED Provider at bedside. 

## 2018-09-21 ENCOUNTER — Other Ambulatory Visit: Payer: Self-pay | Admitting: Neurosurgery

## 2018-09-21 DIAGNOSIS — M50222 Other cervical disc displacement at C5-C6 level: Secondary | ICD-10-CM

## 2018-10-14 ENCOUNTER — Other Ambulatory Visit: Payer: 59

## 2019-09-13 ENCOUNTER — Telehealth: Payer: Self-pay | Admitting: Pulmonary Disease

## 2019-09-13 NOTE — Telephone Encounter (Signed)
Pt has not been seen since 2018. He would need an appointment. LMTCB x1 for pt.

## 2019-09-14 NOTE — Telephone Encounter (Signed)
Spoke with pt. States that he needs his CPAP supplies renewed. Advised him that he would need an OV. Pt has been scheduled for a televisit with Sarah on 09/20/2019 at 1100. Pt is enrolled in [employer] so his download is accessible for Judson Roch. Nothing further was needed at this time.

## 2019-09-17 ENCOUNTER — Telehealth: Payer: Self-pay

## 2019-09-17 NOTE — Telephone Encounter (Signed)
NOTES ON FILE FROM  [facility] [phone] SENT REFERRAL TO SCHEDULING

## 2019-09-20 ENCOUNTER — Other Ambulatory Visit: Payer: Self-pay

## 2019-09-20 ENCOUNTER — Encounter: Payer: Self-pay | Admitting: Acute Care

## 2019-09-20 ENCOUNTER — Ambulatory Visit (INDEPENDENT_AMBULATORY_CARE_PROVIDER_SITE_OTHER): Payer: 59 | Admitting: Acute Care

## 2019-09-20 DIAGNOSIS — G4733 Obstructive sleep apnea (adult) (pediatric): Secondary | ICD-10-CM | POA: Diagnosis not present

## 2019-09-20 NOTE — Progress Notes (Signed)
Virtual Visit via Telephone Note  I connected with Robert [name] on 09/20/19 at 11:00 AM EDT by telephone and verified that I am speaking with the correct person using two identifiers.  Location: Patient: At work Provider: Working remotely from home   I discussed the limitations, risks, security and privacy concerns of performing an evaluation and management service by telephone and the availability of in person appointments. I also discussed with the patient that there may be a patient responsible charge related to this service. The patient expressed understanding and agreed to proceed.  Synopsis: Former Dr Corrie Dandy patient. Needs compliance check for DOT. Wears CPAP nightly, pressure 5-15cmH2O. Denies problems with mask or pressure.  DME: [facility]  History of Present Illness: Pt. Presents for follow up. He was last seen 09/2016. He called in to the office for new CPAP supplies, and was told he needed an appointment to receive new supplies as it had been 3 years since last visit. Down Load looks good.He is compliant every night with his device. No daytime sleepiness, no am headaches. He wears is therapy average of 9 hours and 10 minutes per night. AHI is 2.9 on Auto Set of 5-15 cm H2O. Average pressure is 13.3 cm H2O.    Observations/Objective: Sleep tests PSG 04/09/01 >> AHI 46 Auto CPAP 09/11/16 to 10/10/16 >> used on 30 of 30 nights with average 8 hrs 25 min.  Average AHI 1.5 with median CPAP 14 and 95 th percentile CPAP 15 cm H2O  Down Load Usage 30/30 nights AHI is 2.9  Assessment and Plan: OSA Excellent compliance AHI of 2.9 Plan We will place an ordr with your DME for CPAP supplies Continue on CPAP at bedtime. You appear to be benefiting from the treatment  Goal is to wear for at least 6 hours each night for maximal clinical benefit. Continue to work on weight loss, as the link between excess weight  and sleep apnea is well established.   Remember to establish a  good bedtime routine, and work on sleep hygiene.  Limit daytime naps , avoid stimulants such as caffeine and nicotine close to bedtime, exercise daily to promote sleep quality, avoid heavy , spicy, fried , or rich foods before bed. Ensure adequate exposure to natural light during the day,establish a relaxing bedtime routine with a pleasant sleep environment ( Bedroom between 60 and 67 degrees, turn off bright lights , TV or device screens screens , consider black out curtains or white noise machines) Do not drive if sleepy. Remember to clean mask, tubing, filter, and reservoir once weekly with soapy water.  Please contact office for sooner follow up if symptoms do not improve or worsen or seek emergency care   Follow Up Instructions: Follow up with Dr. Halford Chessman or Judson Roch NP   In 6 months  or before as needed.     I discussed the assessment and treatment plan with the patient. The patient was provided an opportunity to ask questions and all were answered. The patient agreed with the plan and demonstrated an understanding of the instructions.   The patient was advised to call back or seek an in-person evaluation if the symptoms worsen or if the condition fails to improve as anticipated.  I provided 31 minutes of non-face-to-face time during this encounter.   Magdalen Spatz, NP  09/20/2019

## 2019-09-20 NOTE — Progress Notes (Signed)
Reviewed and agree with assessment/plan.   Chesley Mires, MD [facility] Pulmonary/Critical Care 09/20/2019, 11:41 AM Pager:  [phone]

## 2019-09-20 NOTE — Patient Instructions (Signed)
  It was great to talk with you today. We will order CPAP supplies for you.  Continue on CPAP at bedtime. You appear to be benefiting from the treatment  Goal is to wear for at least 6 hours each night for maximal clinical benefit. Continue to work on weight loss, as the link between excess weight  and sleep apnea is well established.   Remember to establish a good bedtime routine, and work on sleep hygiene.  Limit daytime naps , avoid stimulants such as caffeine and nicotine close to bedtime, exercise daily to promote sleep quality, avoid heavy , spicy, fried , or rich foods before bed. Ensure adequate exposure to natural light during the day,establish a relaxing bedtime routine with a pleasant sleep environment ( Bedroom between 60 and 67 degrees, turn off bright lights , TV or device screens screens , consider black out curtains or white noise machines) Do not drive if sleepy. Remember to clean mask, tubing, filter, and reservoir once weekly with soapy water.  Follow up with  Dr. Halford Chessman or Judson Roch NP   In 6 months  or before as needed. Please contact office for sooner follow up if symptoms do not improve or worsen or seek emergency care

## 2019-09-22 DIAGNOSIS — G4733 Obstructive sleep apnea (adult) (pediatric): Secondary | ICD-10-CM

## 2019-09-22 NOTE — Telephone Encounter (Signed)
SG can we send order for CPAP supplies?

## 2019-09-24 LAB — EXTERNAL GENERIC LAB PROCEDURE: COLOGUARD: NEGATIVE

## 2019-11-02 ENCOUNTER — Other Ambulatory Visit: Payer: Self-pay

## 2019-11-12 NOTE — Progress Notes (Signed)
Cardiology Consult Note    Date:  11/15/2019   ID:  Robert [name], DOB Jun 09, 1970, MRN [id number]  PCP:  Chesley Noon, MD  Cardiologist:  Fransico Him, MD   Chief Complaint  Patient presents with  . New Patient (Initial Visit)    Fm hx of CAD and elevated coronary Ca score    History of Present Illness:  Robert [name] is a 49 y.o. male who is being seen today for the evaluation of DOE at the request of Vicenta Aly, [place].  This is a 49yo male with a hx of HTN, fm hx of ASCAD, morbid obesity, OSA, HLD, tobacco abuse and impaired fasting glucose.  He is now referred for evaluation of fm hx of CAD and elevated coronary Ca score of 260 which was 90th% for his age and sex.  He has a family hx of CAD with his father having CABG in his 44's.  He has smoked for almost 40 years but has cut back.  He denies any chest pain or pressure, LE edema, dizziness, syncope, PND or orthopnea.  He has chronic DOE from obesity.  He has OSA on CPAP therapy.    Past Medical History:  Diagnosis Date  . Abnormal thyroid blood test   . Coronary artery calcification    Ca score is 290 on scan 10/2019  . Essential hypertension   . Exertional dyspnea 08/30/2015  . FH: coronary artery disease   . IFG (impaired fasting glucose)   . Mixed hyperlipidemia   . Morbid obesity with BMI of 45.0-49.9, adult ([place])   . Nephrolithiasis   . OSA (obstructive sleep apnea)   . Tobacco user 08/30/2015    Past Surgical History:  Procedure Laterality Date  . CARPAL TUNNEL RELEASE  2013  . [place]   internal removal    Current Medications: Current Meds  Medication Sig  . diphenhydrAMINE (BENADRYL) 25 MG tablet Take 100 mg by mouth every 6 (six) hours as needed.  . famotidine (PEPCID) 20 MG tablet Take 1 tablet by mouth daily as needed.  Marland Kitchen oxyCODONE-acetaminophen (PERCOCET) 10-325 MG tablet Take 1 tablet by mouth every 6 (six) hours as needed for pain.  . rosuvastatin (CRESTOR) 10 MG  tablet Take 1 tablet by mouth at bedtime.  . valsartan-hydrochlorothiazide (DIOVAN-HCT) 160-12.5 MG tablet Take 1 tablet by mouth daily.    Allergies:   Codeine, Penicillins, and Shellfish allergy   Social History   Socioeconomic History  . Marital status: Married    Spouse name: Not on file  . Number of children: Not on file  . Years of education: Not on file  . Highest education level: Not on file  Occupational History  . Occupation: Regulartory Compliance     Employer: [employer]  Tobacco Use  . Smoking status: Current Some Day Smoker    Packs/day: 0.50    Years: 25.00    Pack years: 12.50    Types: Cigarettes  . Smokeless tobacco: Never Used  . Tobacco comment: heaviest was 1 PPD x 4 years  Vaping Use  . Vaping Use: Never used  Substance and Sexual Activity  . Alcohol use: Yes    Comment: rare  . Drug use: No  . Sexual activity: Not on file  Other Topics Concern  . Not on file  Social History Narrative  . Not on file   Social Determinants of Health   Financial Resource Strain:   . Difficulty of Paying  Living Expenses: Not on file  Food Insecurity:   . Worried About Charity fundraiser in the Last Year: Not on file  . Ran Out of Food in the Last Year: Not on file  Transportation Needs:   . Lack of Transportation (Medical): Not on file  . Lack of Transportation (Non-Medical): Not on file  Physical Activity:   . Days of Exercise per Week: Not on file  . Minutes of Exercise per Session: Not on file  Stress:   . Feeling of Stress : Not on file  Social Connections:   . Frequency of Communication with Friends and Family: Not on file  . Frequency of Social Gatherings with Friends and Family: Not on file  . Attends Religious Services: Not on file  . Active Member of Clubs or Organizations: Not on file  . Attends Archivist Meetings: Not on file  . Marital Status: Not on file     Family History:  The patient's family history includes  Allergies in his mother; Diabetes in his father and mother; Heart disease in his father; Hypertension in his mother; Pancreatic cancer in his father; Stroke in his father.   ROS:   Please see the history of present illness.    ROS All other systems reviewed and are negative.  No flowsheet data found.     PHYSICAL EXAM:   VS:  BP 120/70   Pulse 73   Ht 5' 7"  (1.702 m)   Wt 298 lb (135.2 kg)   SpO2 97%   BMI 46.67 kg/m    GEN: Well nourished, well developed, in no acute distress  HEENT: normal  Neck: no JVD, carotid bruits, or masses Cardiac: RRR; no murmurs, rubs, or gallops,no edema.  Intact distal pulses bilaterally.  Respiratory:  clear to auscultation bilaterally, normal work of breathing GI: soft, nontender, nondistended, + BS MS: no deformity or atrophy  Skin: warm and dry, no rash Neuro:  Alert and Oriented x 3, Strength and sensation are intact Psych: euthymic mood, full affect  Wt Readings from Last 3 Encounters:  11/15/19 298 lb (135.2 kg)  09/13/18 (!) 310 lb (140.6 kg)  10/17/16 294 lb (133.4 kg)      Studies/Labs Reviewed:   EKG:  EKG is ordered today.  The ekg ordered today demonstrates NSR with no ST changes and normal intervals  Recent Labs: No results found for requested labs within last 8760 hours.   Lipid Panel No results found for: CHOL, TRIG, HDL, CHOLHDL, VLDL, LDLCALC, LDLDIRECT  Additional studies/ records that were reviewed today include:  OV notes from PCP, EKG    ASSESSMENT:    1. Agatston coronary artery calcium score between 200 and 399      PLAN:  In order of problems listed above:  1. Coronary artery calcifications -his coronary Ca score is 290 which is 90th% for age and sex matched controls -he needs to lose weight -needs aggressive control of preDM and HLD -continue statin -he has had problems with exertional fatigue so will get a Lexiscan myoview to rule out ischemia  2.  Morbid Obesity -He needs to lose significant  weight as BMI is 46 -I will refer him to Healthy Weight and [place]  3.  HLD -LDL goal < 70 due to coronary calcium -He was started on Crestor 36m daily but is having fatigue which he thinks may be due to the statin -refer to lipid clinic  4.  Tobacco use counseling -we discussed smoking  cessation drugs -he is willing to try Chantix which we will try  Followup with me in 1 year if stress test is ok  Medication Adjustments/Labs and Tests Ordered: Current medicines are reviewed at length with the patient today.  Concerns regarding medicines are outlined above.  Medication changes, Labs and Tests ordered today are listed in the Patient Instructions below.  There are no Patient Instructions on file for this visit.   Signed, Fransico Him, MD  11/15/2019 9:18 AM    [facility] [place] [place], [place], [place]  [address] Phone: [phone]; Fax: [phone]

## 2019-11-15 ENCOUNTER — Encounter: Payer: Self-pay | Admitting: Cardiology

## 2019-11-15 ENCOUNTER — Ambulatory Visit (INDEPENDENT_AMBULATORY_CARE_PROVIDER_SITE_OTHER): Payer: 59 | Admitting: Cardiology

## 2019-11-15 ENCOUNTER — Other Ambulatory Visit: Payer: Self-pay

## 2019-11-15 VITALS — BP 120/70 | HR 73 | Ht 67.0 in | Wt 298.0 lb

## 2019-11-15 DIAGNOSIS — E785 Hyperlipidemia, unspecified: Secondary | ICD-10-CM | POA: Diagnosis not present

## 2019-11-15 DIAGNOSIS — R931 Abnormal findings on diagnostic imaging of heart and coronary circulation: Secondary | ICD-10-CM | POA: Diagnosis not present

## 2019-11-15 DIAGNOSIS — Z87891 Personal history of nicotine dependence: Secondary | ICD-10-CM | POA: Diagnosis not present

## 2019-11-15 NOTE — Patient Instructions (Signed)
Medication Instructions:  Your physician recommends that you continue on your current medications as directed. Please refer to the Current Medication list given to you today.  *If you need a refill on your cardiac medications before your next appointment, please call your pharmacy*  Testing/Procedures: Your physician has requested that you have a lexiscan myoview. For further information please visit [email or web address]. Please follow instruction sheet, as given.  Follow-Up: At [facility], you and your health needs are our priority.  As part of our continuing mission to provide you with exceptional heart care, we have created designated Provider Care Teams.  These Care Teams include your primary Cardiologist (physician) and Advanced Practice Providers (APPs -  Physician Assistants and Nurse Practitioners) who all work together to provide you with the care you need, when you need it.  Your next appointment:   1 year(s)  The format for your next appointment:   In Person  Provider:   You may see Fransico Him, MD or one of the following Advanced Practice Providers on your designated Care Team:    Melina Copa, PA-C  Ermalinda Barrios, PA-C  Other Instructions You have been referred to the healthy weight and wellness program.   You have been referred to see our Pharm D in the [facility]

## 2019-12-08 ENCOUNTER — Telehealth (HOSPITAL_COMMUNITY): Payer: Self-pay

## 2019-12-08 NOTE — Telephone Encounter (Signed)
Detailed instructions left on the answering machine, asked to call back with any questions. S.Kojo Liby EMTP

## 2019-12-09 ENCOUNTER — Other Ambulatory Visit: Payer: Self-pay

## 2019-12-09 ENCOUNTER — Ambulatory Visit (HOSPITAL_COMMUNITY): Payer: 59 | Attending: Cardiology

## 2019-12-09 DIAGNOSIS — R931 Abnormal findings on diagnostic imaging of heart and coronary circulation: Secondary | ICD-10-CM | POA: Diagnosis present

## 2019-12-09 MED ORDER — TECHNETIUM TC 99M TETROFOSMIN IV KIT
32.8000 | PACK | Freq: Once | INTRAVENOUS | Status: AC | PRN
  Administered 2019-12-09: 32.8 via INTRAVENOUS
  Filled 2019-12-09: qty 33

## 2019-12-09 MED ORDER — REGADENOSON 0.4 MG/5ML IV SOLN
0.4000 mg | Freq: Once | INTRAVENOUS | Status: AC
  Administered 2019-12-09: 0.4 mg via INTRAVENOUS

## 2019-12-10 ENCOUNTER — Ambulatory Visit (INDEPENDENT_AMBULATORY_CARE_PROVIDER_SITE_OTHER): Payer: 59 | Admitting: Pharmacist

## 2019-12-10 ENCOUNTER — Ambulatory Visit (HOSPITAL_COMMUNITY): Payer: 59 | Attending: Internal Medicine

## 2019-12-10 DIAGNOSIS — Z87891 Personal history of nicotine dependence: Secondary | ICD-10-CM

## 2019-12-10 DIAGNOSIS — E785 Hyperlipidemia, unspecified: Secondary | ICD-10-CM | POA: Diagnosis not present

## 2019-12-10 LAB — MYOCARDIAL PERFUSION IMAGING
LV dias vol: 82 mL (ref 62–150)
LV sys vol: 27 mL
Peak HR: 93 {beats}/min
Rest HR: 70 {beats}/min
SDS: 0
SRS: 0
SSS: 0
TID: 1.31

## 2019-12-10 MED ORDER — ATORVASTATIN CALCIUM 10 MG PO TABS: 10.0000 mg | ORAL_TABLET | Freq: Every day | ORAL | 0 refills | Status: DC

## 2019-12-10 MED ORDER — NICOTINE POLACRILEX 2 MG MT GUM: 2.0000 mg | CHEWING_GUM | OROMUCOSAL | 1 refills | Status: AC | PRN

## 2019-12-10 MED ORDER — NICOTINE 14 MG/24HR TD PT24: 14.0000 mg | MEDICATED_PATCH | Freq: Every day | TRANSDERMAL | 2 refills | Status: DC

## 2019-12-10 MED ORDER — TECHNETIUM TC 99M TETROFOSMIN IV KIT
31.7000 | PACK | Freq: Once | INTRAVENOUS | Status: AC | PRN
  Administered 2019-12-10: 31.7 via INTRAVENOUS
  Filled 2019-12-10: qty 32

## 2019-12-10 NOTE — Progress Notes (Signed)
Patient ID: PRATT BRESS                 DOB: Mar 04, 1971                    MRN: [id number]     HPI: Robert [name] is a 49 y.o. male patient referred to lipid clinic by Dr. Radford Pax. PMH is significant for HTN, morbid obesity, OSA, HLD, tobacco use. Patient has coronary artery calcium score 290. Patient saw Dr. Radford Pax on 11/15/2019. At that time, patient complained of fatigue, which he believed was caused by rosuvastatin.  Patient presents to lipid clinic today for follow up. He reports stopping rosuvastatin after his last appointment and his fatigue resolved. He "feels great now." Patient has tried no other statins in the past. Patient currently smokes about 5-6 cigarettes per day, and his diet includes plenty of fried foods, carbs, and foods high in sodium.   Current Medications: None Intolerances: Rosuvastatin 10 mg (fatigue) Risk Factors: HTN, morbid obesity, HLD, tobacco use, FH of ASCVD LDL goal: <70 mg/dL  Diet:  Breakfast: ham, cheese, sausage biscuits Lunch: chicken tenders, eats out at work, Education administrator: baked flounder, mayonnaise, cheese; crusted chicken parm; broccoli; pasta; green beans, corn Drinks mostly water, diet Pepsi, sometimes soda, rare alcohol (2 beers over last 10 days) Loves sweets; sugar free jello, canteloup, apples bananas  Exercise: 3 days of heavy lifting per week. Not much cardio. Works from home now. Used to walk a lot, but not anymore.  Family History: CAD (father); stroke (father); diabetes (father, mother); HTN (mother)  Social History: Has smoked for 25 years. Has quit before in 2013 using nicotine patch. Was previously smoking 2-3 cigarettes per day but now smokes 5-6 cigarettes per day after going on a trip.  Labs: Lipid panel (09/09/19): LDL 115, TG 129, HDL 32, TC 170 (no statin)   Past Medical History:  Diagnosis Date  . Abnormal thyroid blood test   . Coronary artery calcification    Ca score is 290 on scan 10/2019  . Essential  hypertension   . Exertional dyspnea 08/30/2015  . FH: coronary artery disease   . IFG (impaired fasting glucose)   . Mixed hyperlipidemia   . Morbid obesity with BMI of 45.0-49.9, adult ([place])   . Nephrolithiasis   . OSA (obstructive sleep apnea)   . Tobacco user 08/30/2015    Current Outpatient Medications on File Prior to Visit  Medication Sig Dispense Refill  . diphenhydrAMINE (BENADRYL) 25 MG tablet Take 100 mg by mouth every 6 (six) hours as needed.    . famotidine (PEPCID) 20 MG tablet Take 1 tablet by mouth daily as needed.    Marland Kitchen oxyCODONE-acetaminophen (PERCOCET) 10-325 MG tablet Take 1 tablet by mouth every 6 (six) hours as needed for pain. 20 tablet 0  . rosuvastatin (CRESTOR) 10 MG tablet Take 1 tablet by mouth at bedtime.    . valsartan-hydrochlorothiazide (DIOVAN-HCT) 160-12.5 MG tablet Take 1 tablet by mouth daily.     No current facility-administered medications on file prior to visit.    Allergies  Allergen Reactions  . Codeine   . Penicillins     Possible reaction as a child.  Marland Kitchen Shellfish Allergy     Assessment/Plan:  1. Hyperlipidemia - Patient's LDL is above goal of <70 mg/dL. Would like patient to be on high-intensity statin. Patient does not tolerate rosuvastatin 10 mg due to fatigue; however, he has no complaints of muscle aches or  joint pain on rosuvastatin. Since he has not tried other statins in the past, we will start atorvastatin 10 mg daily to see how he tolerates this.  Patient has several lifestyle habits that may be contributing to his hyperlipidemia. Counseled patient on the importance of daily cardio exercise, such as walking 20-30 min daily. Discussed extensively with patient how to eat a heart-healthy diet, including plenty of fresh vegetables/fruits, lean meats, and whole grains, as well as limited saturated fats, carbohydrates, and sodium. Patient is motivated to eat better. Patient is currently smoking every day but expresses a desire to quit smoking.  We recommended patient use a combination of nicotine patch 14 mg and nicotine gum 2 mg.   Patient will be out of town for the next few weeks. Plan to follow up with patient via phone call first week of October to assess how he is tolerating atorvastatin. If patient is doing well, we can titrate up then.    Thank you,  Esmeralda Links (PharmD Candidate 2022)  Karren Cobble, PharmD, BCACP, [place] [address], [place], [place] [address] Phone: [phone]; Fax: [phone] 12/10/2019 3:37 PM

## 2019-12-10 NOTE — Patient Instructions (Addendum)
Great to see you today!  Here is the plan we discussed today:  START atorvastatin 10 mg once daily. Call us if you have any side effects such as muscle aches or fatigue. We will follow up with you over phone call to gradually increase the dose.  START eating a heart-healthy diet (see handout).  INCREASE the amount of cardio exercise you engage in each week. It is recommended that you get about 20-30 minutes daily of strenuous physical activity. You can start by taking walks after dinner.  DECREASE the amount of cigarettes that you smoke per day. You can use a nicotine patch (72m) in combination with nicotine gum or lozenges to help with this.   Please call u[place]oreaif you have any other questions or concerns: [phone]

## 2019-12-28 ENCOUNTER — Telehealth: Payer: Self-pay | Admitting: Pharmacist

## 2019-12-28 DIAGNOSIS — E785 Hyperlipidemia, unspecified: Secondary | ICD-10-CM

## 2019-12-28 MED ORDER — EZETIMIBE 10 MG PO TABS: 10.0000 mg | ORAL_TABLET | Freq: Every day | ORAL | 1 refills | Status: AC

## 2019-12-28 NOTE — Telephone Encounter (Signed)
Spoke with patient.  Patient reports he is not tolerating atorvastatin.  Self discontinued due to back and neck pain.  Reports pain has gone away since discontinuing.  Has started nicotine patches and gum.  Has not been able to completely discontinue cigarettes yet.  Will stop atorvastatin at this time and begin zetia 10 mg daily.  Lab work order placed for early December.  Patient encouraged to continue weight loss activities.

## 2020-01-02 ENCOUNTER — Other Ambulatory Visit: Payer: Self-pay | Admitting: Cardiology

## 2020-01-02 DIAGNOSIS — E785 Hyperlipidemia, unspecified: Secondary | ICD-10-CM

## 2020-01-04 NOTE — Telephone Encounter (Signed)
Telephone 12/28/2019 [place] [facility] [place], Ryan, [place] Pharmacist Hyperlipidemia LDL goal <70 Dx Medication Problem  Medication Management Reason for call  Conversation: Medication Problem (Newest Message First) Robert [name], [name]        12/28/19 10:32 AM      Note Spoke with patient.  Patient reports he is not tolerating atorvastatin.  Self discontinued due to back and neck pain.  Reports pain has gone away since discontinuing.   Has started nicotine patches and gum.  Has not been able to completely discontinue cigarettes yet.   Will stop atorvastatin at this time and begin zetia 10 mg daily.  Lab work order placed for early December.  Patient encouraged to continue weight loss activities.

## 2020-02-29 ENCOUNTER — Other Ambulatory Visit: Payer: 59

## 2020-02-29 ENCOUNTER — Telehealth: Payer: Self-pay | Admitting: Pharmacist

## 2020-02-29 NOTE — Telephone Encounter (Signed)
Left message on machine for patient to have lipid panel drawn

## 2020-03-09 NOTE — Telephone Encounter (Signed)
Patient had Covid.  Called patient back to schedule lipid panel.  Left message on machine

## 2020-04-12 ENCOUNTER — Other Ambulatory Visit: Payer: Self-pay | Admitting: Cardiology

## 2020-04-12 DIAGNOSIS — Z87891 Personal history of nicotine dependence: Secondary | ICD-10-CM

## 2020-04-12 NOTE — Telephone Encounter (Signed)
Pt pharmacy is requesting a refill on nicotine patches. This medication has expired off of pt's medication list. Would Dr. Radford Pax like to refill this medication? Please address

## 2020-04-14 ENCOUNTER — Encounter (INDEPENDENT_AMBULATORY_CARE_PROVIDER_SITE_OTHER): Payer: Self-pay

## 2020-07-19 ENCOUNTER — Other Ambulatory Visit: Payer: Self-pay | Admitting: Cardiology

## 2020-07-19 DIAGNOSIS — Z87891 Personal history of nicotine dependence: Secondary | ICD-10-CM

## 2020-07-25 ENCOUNTER — Other Ambulatory Visit: Payer: Self-pay | Admitting: Cardiology

## 2020-07-25 DIAGNOSIS — Z87891 Personal history of nicotine dependence: Secondary | ICD-10-CM

## 2020-10-12 ENCOUNTER — Other Ambulatory Visit: Payer: Self-pay | Admitting: Cardiology

## 2020-10-12 DIAGNOSIS — Z87891 Personal history of nicotine dependence: Secondary | ICD-10-CM

## 2020-10-19 ENCOUNTER — Telehealth: Payer: Self-pay

## 2020-10-19 NOTE — Telephone Encounter (Signed)
**Note De-Identified Baby Stairs Obfuscation** I started a Nicotine 14 mg/24 hr Patch PA through covermymeds. Key: [id number]

## 2020-10-24 NOTE — Telephone Encounter (Signed)
**Note De-Identified Odel Schmid Obfuscation** Letter received from [facility] [employer] [facility] fax stating that Nicotine Patch is not covered by the pts plan and is an exclusion from coverage.  I have notified [facility] of this denial.

## 2021-04-05 ENCOUNTER — Ambulatory Visit: Payer: 59 | Admitting: Primary Care

## 2021-10-31 ENCOUNTER — Encounter (INDEPENDENT_AMBULATORY_CARE_PROVIDER_SITE_OTHER): Payer: Self-pay

## 2021-12-09 ENCOUNTER — Encounter (HOSPITAL_BASED_OUTPATIENT_CLINIC_OR_DEPARTMENT_OTHER): Payer: Self-pay

## 2021-12-09 ENCOUNTER — Emergency Department (HOSPITAL_BASED_OUTPATIENT_CLINIC_OR_DEPARTMENT_OTHER): Payer: 59

## 2021-12-09 ENCOUNTER — Other Ambulatory Visit: Payer: Self-pay

## 2021-12-09 ENCOUNTER — Emergency Department (HOSPITAL_BASED_OUTPATIENT_CLINIC_OR_DEPARTMENT_OTHER)
Admission: EM | Admit: 2021-12-09 | Discharge: 2021-12-09 | Disposition: A | Discharge status: Home / Self Care | Payer: 59 | Attending: Emergency Medicine | Admitting: Emergency Medicine

## 2021-12-09 DIAGNOSIS — R059 Cough, unspecified: Secondary | ICD-10-CM | POA: Diagnosis present

## 2021-12-09 DIAGNOSIS — J209 Acute bronchitis, unspecified: Secondary | ICD-10-CM | POA: Insufficient documentation

## 2021-12-09 DIAGNOSIS — I10 Essential (primary) hypertension: Secondary | ICD-10-CM | POA: Insufficient documentation

## 2021-12-09 DIAGNOSIS — Z20822 Contact with and (suspected) exposure to covid-19: Secondary | ICD-10-CM | POA: Diagnosis not present

## 2021-12-09 DIAGNOSIS — F17200 Nicotine dependence, unspecified, uncomplicated: Secondary | ICD-10-CM | POA: Diagnosis not present

## 2021-12-09 LAB — SARS CORONAVIRUS 2 BY RT PCR: SARS Coronavirus 2 by RT PCR: NEGATIVE

## 2021-12-09 MED ORDER — ALBUTEROL SULFATE (2.5 MG/3ML) 0.083% IN NEBU
5.0000 mg | INHALATION_SOLUTION | Freq: Once | RESPIRATORY_TRACT | Status: AC
  Administered 2021-12-09: 5 mg via RESPIRATORY_TRACT
  Filled 2021-12-09: qty 6

## 2021-12-09 MED ORDER — BENZONATATE 100 MG PO CAPS: 100.0000 mg | ORAL_CAPSULE | Freq: Three times a day (TID) | ORAL | 0 refills | Status: AC | PRN

## 2021-12-09 MED ORDER — ALBUTEROL SULFATE HFA 108 (90 BASE) MCG/ACT IN AERS: 2.0000 | INHALATION_SPRAY | RESPIRATORY_TRACT | 0 refills | Status: AC | PRN

## 2021-12-09 NOTE — ED Provider Notes (Signed)
[facility] EMERGENCY DEPARTMENT Provider Note   CSN: [id number] Arrival date & time: 12/09/21  7939     History  Chief Complaint  Patient presents with   Cough    Robert [name] is a 51 y.o. male.  The history is provided by the patient and medical records.  Cough  Robert [name] is a 51 y.o. male who presents to the Emergency Department complaining of cough.  He presents to the emergency department for evaluation of cough that started Tuesday early in the morning.  He woke up with a tickle in his throat with associated nasal congestion and runny nose.  He has chest discomfort with coughing and cough is productive of brown sputum.  No associated hemoptysis.  No associated fever, shortness of breath, pain on breathing.  No nausea, vomiting, abdominal pain.  No lower extremity edema or pain.  He did recently travel by airplane and states that the person sitting in front of him on Sunday was coughing significantly.  He returns to [place] on Friday.  He did take a home COVID test, which was negative.    Smokes tobacco.    Hx/o HTN, HPL.  No hx/o DVT/PE.  No family hx/o DVT/PE.    Has tried otc cough/cold medicines without improvement.      Home Medications Prior to Admission medications   Medication Sig Start Date End Date Taking? Authorizing Provider  albuterol (VENTOLIN HFA) 108 (90 Base) MCG/ACT inhaler Inhale 2 puffs into the lungs every 4 (four) hours as needed for wheezing or shortness of breath. 12/09/21  Yes Tilden Fossa, MD  benzonatate (TESSALON) 100 MG capsule Take 1 capsule (100 mg total) by mouth 3 (three) times daily as needed for cough. 12/09/21  Yes Tilden Fossa, MD  DICLOFENAC PO Take by mouth once as needed.   Yes [provider]  Pseudoephedrine-Acetaminophen (SM NON-ASPRIN SINUS PO) Take 81 mg by mouth daily.   Yes [provider]  VALSARTAN PO Take by mouth at bedtime.   Yes [provider]  diphenhydrAMINE  (BENADRYL) 25 MG tablet Take 100 mg by mouth every 6 (six) hours as needed.    [provider]  ezetimibe (ZETIA) 10 MG tablet Take 1 tablet (10 mg total) by mouth daily. 12/28/19 06/25/20  Quintella Reichert, MD  famotidine (PEPCID) 20 MG tablet Take 1 tablet by mouth daily as needed. 10/18/16   [provider]  nicotine (NICODERM CQ - DOSED IN MG/24 HOURS) 14 mg/24hr patch APPLY 1 PATCH ONTO THE SKIN EVERY DAY 10/12/20   Quintella Reichert, MD  oxyCODONE-acetaminophen (PERCOCET) 10-325 MG tablet Take 1 tablet by mouth every 6 (six) hours as needed for pain. 09/13/18   Molpus, John, MD  valsartan-hydrochlorothiazide (DIOVAN-HCT) 160-12.5 MG tablet Take 1 tablet by mouth daily. 07/21/19   [provider]      Allergies    Codeine, Crestor [rosuvastatin], Lipitor [atorvastatin], Penicillins, and Shellfish allergy    Review of Systems   Review of Systems  Respiratory:  Positive for cough.   All other systems reviewed and are negative.   Physical Exam Updated Vital Signs BP 109/63 (BP Location: Right Arm)   Pulse 86   Temp 98.1 F (36.7 C) (Oral)   Resp 18   Ht 5\' 7"  (1.702 m)   Wt 130 kg   SpO2 94%   BMI 44.90 kg/m  Physical Exam Vitals and nursing note reviewed.  Constitutional:      Appearance: He is well-developed.  HENT:     Head: Normocephalic and atraumatic.  Cardiovascular:     Rate and Rhythm: Normal rate and regular rhythm.     Heart sounds: No murmur heard. Pulmonary:     Effort: Pulmonary effort is normal. No respiratory distress.     Comments: Occasional wheeze in right lung base Abdominal:     Palpations: Abdomen is soft.     Tenderness: There is no abdominal tenderness. There is no guarding or rebound.  Musculoskeletal:        General: No tenderness.  Skin:    General: Skin is warm and dry.  Neurological:     Mental Status: He is alert and oriented to person, place, and time.  Psychiatric:        Behavior: Behavior normal.     ED  Results / Procedures / Treatments   Labs (all labs ordered are listed, but only abnormal results are displayed) Labs Reviewed  SARS CORONAVIRUS 2 BY RT PCR    EKG None  Radiology DG [facility] 1 View  Result Date: 12/09/2021 CLINICAL DATA:  Cough.  Body aches and rib pain while coughing. EXAM: PORTABLE CHEST 1 VIEW COMPARISON:  08/30/2015 FINDINGS: Heart size and mediastinal contours are unremarkable. No pleural effusion or edema identified. No airspace opacities identified. Visualized osseous structures are unremarkable. IMPRESSION: No acute cardiopulmonary abnormalities. Electronically Signed   By: Kerby Moors M.D.   On: 12/09/2021 06:54    Procedures Procedures    Medications Ordered in ED Medications  albuterol (PROVENTIL) (2.5 MG/3ML) 0.083% nebulizer solution 5 mg (5 mg Nebulization Given 12/09/21 1610)    ED Course/ Medical Decision Making/ A&P                           Medical Decision Making Amount and/or Complexity of Data Reviewed Radiology: ordered.  Risk Prescription drug management.   Patient here for evaluation of nasal congestion, cough.  He did have a sick contact while traveling.  Patient with occasional right lower lobe wheeze on examination.  Chest x-ray without acute infiltrate.  He was treated with nebulizer with no significant change in his symptoms but resolution of wheeze on reevaluation.  He is negative for COVID-19.  Current clinical picture is not consistent with PE, acute CHF.  Discussed with patient home care for viral bronchitis with OTC medications.  Will prescribe Tessalon Perles as needed.  We will also prescribe albuterol if he develops any significant wheezing.  Return precautions discussed.        Final Clinical Impression(s) / ED Diagnoses Final diagnoses:  Acute bronchitis, unspecified organism    Rx / DC Orders ED Discharge Orders          Ordered    benzonatate (TESSALON) 100 MG capsule  3 times daily PRN        12/09/21  0716    albuterol (VENTOLIN HFA) 108 (90 Base) MCG/ACT inhaler  Every 4 hours PRN        12/09/21 0716              Quintella Reichert, MD 12/09/21 0740

## 2021-12-09 NOTE — ED Notes (Signed)
Portable xray in progress at this time.

## 2021-12-09 NOTE — ED Triage Notes (Addendum)
Pt via POV - reports recent airplane 1 week pta -- reports sitting next to passenger that had been coughing persistently.  States next day developed "tickle in throat'  which developed into prod cough, body aches, rib pain when coughing.  Denies sob, n/v/d and denies known fevers.  Has tried Coricidin, Robitussin, Mucinex DM, Zyrtec D, Dayquil and tylenol at home with no relief.  Pt also reports neg home covid test 2 days pta

## 2023-04-05 LAB — EXTERNAL GENERIC LAB PROCEDURE: COLOGUARD: NEGATIVE
